# Patient Record
Sex: Female | Born: 1955 | Race: White | Hispanic: No | Marital: Married | Smoking: Never smoker
Health system: Southern US, Community
[De-identification: ages and names within clinical notes are randomized; demographics above are authoritative.]

## PROBLEM LIST (undated history)

## (undated) DIAGNOSIS — G43909 Migraine, unspecified, not intractable, without status migrainosus: Secondary | ICD-10-CM

## (undated) DIAGNOSIS — T1491XA Suicide attempt, initial encounter: Secondary | ICD-10-CM

## (undated) DIAGNOSIS — I959 Hypotension, unspecified: Secondary | ICD-10-CM

## (undated) DIAGNOSIS — F0781 Postconcussional syndrome: Secondary | ICD-10-CM

## (undated) DIAGNOSIS — F329 Major depressive disorder, single episode, unspecified: Secondary | ICD-10-CM

## (undated) DIAGNOSIS — S8990XA Unspecified injury of unspecified lower leg, initial encounter: Secondary | ICD-10-CM

## (undated) DIAGNOSIS — D242 Benign neoplasm of left breast: Secondary | ICD-10-CM

## (undated) HISTORY — DX: Migraine, unspecified, not intractable, without status migrainosus: G43.909

## (undated) HISTORY — DX: Benign neoplasm of left breast: D24.2

## (undated) HISTORY — DX: Postconcussional syndrome: F07.81

## (undated) HISTORY — DX: Unspecified injury of unspecified lower leg, initial encounter: S89.90XA

## (undated) HISTORY — DX: Suicide attempt, initial encounter: T14.91XA

---

## 1998-09-28 ENCOUNTER — Emergency Department (HOSPITAL_COMMUNITY): Admission: EM | Admit: 1998-09-28 | Discharge: 1998-09-28 | Payer: Self-pay | Admitting: Emergency Medicine

## 1998-09-28 ENCOUNTER — Encounter: Payer: Self-pay | Admitting: Emergency Medicine

## 2004-07-28 ENCOUNTER — Encounter: Admission: RE | Admit: 2004-07-28 | Discharge: 2004-07-28 | Payer: Self-pay | Admitting: Family Medicine

## 2011-04-05 DIAGNOSIS — T1491XA Suicide attempt, initial encounter: Secondary | ICD-10-CM

## 2011-04-05 HISTORY — DX: Suicide attempt, initial encounter: T14.91XA

## 2011-08-10 ENCOUNTER — Other Ambulatory Visit (HOSPITAL_COMMUNITY)
Admission: RE | Admit: 2011-08-10 | Discharge: 2011-08-10 | Disposition: A | Discharge status: Home / Self Care | Payer: BC Managed Care – PPO | Source: Ambulatory Visit | Attending: Family Medicine | Admitting: Family Medicine

## 2011-08-10 DIAGNOSIS — Z1159 Encounter for screening for other viral diseases: Secondary | ICD-10-CM | POA: Insufficient documentation

## 2011-08-10 DIAGNOSIS — Z124 Encounter for screening for malignant neoplasm of cervix: Secondary | ICD-10-CM | POA: Insufficient documentation

## 2011-10-18 ENCOUNTER — Observation Stay (HOSPITAL_COMMUNITY)
Admission: EM | Admit: 2011-10-18 | Discharge: 2011-10-19 | Disposition: A | Discharge status: Home / Self Care | Payer: BC Managed Care – PPO | Attending: Internal Medicine | Admitting: Internal Medicine

## 2011-10-18 ENCOUNTER — Encounter (HOSPITAL_COMMUNITY): Payer: Self-pay | Admitting: Emergency Medicine

## 2011-10-18 ENCOUNTER — Emergency Department (HOSPITAL_COMMUNITY): Payer: BC Managed Care – PPO

## 2011-10-18 DIAGNOSIS — E876 Hypokalemia: Secondary | ICD-10-CM | POA: Insufficient documentation

## 2011-10-18 DIAGNOSIS — T450X4A Poisoning by antiallergic and antiemetic drugs, undetermined, initial encounter: Secondary | ICD-10-CM | POA: Insufficient documentation

## 2011-10-18 DIAGNOSIS — T391X1A Poisoning by 4-Aminophenol derivatives, accidental (unintentional), initial encounter: Secondary | ICD-10-CM | POA: Insufficient documentation

## 2011-10-18 DIAGNOSIS — T39314A Poisoning by propionic acid derivatives, undetermined, initial encounter: Principal | ICD-10-CM | POA: Insufficient documentation

## 2011-10-18 DIAGNOSIS — R000 Tachycardia, unspecified: Secondary | ICD-10-CM | POA: Insufficient documentation

## 2011-10-18 DIAGNOSIS — T50901A Poisoning by unspecified drugs, medicaments and biological substances, accidental (unintentional), initial encounter: Secondary | ICD-10-CM

## 2011-10-18 DIAGNOSIS — E860 Dehydration: Secondary | ICD-10-CM | POA: Insufficient documentation

## 2011-10-18 DIAGNOSIS — IMO0002 Reserved for concepts with insufficient information to code with codable children: Secondary | ICD-10-CM | POA: Diagnosis present

## 2011-10-18 DIAGNOSIS — R4182 Altered mental status, unspecified: Secondary | ICD-10-CM | POA: Insufficient documentation

## 2011-10-18 DIAGNOSIS — T398X2A Poisoning by other nonopioid analgesics and antipyretics, not elsewhere classified, intentional self-harm, initial encounter: Secondary | ICD-10-CM | POA: Insufficient documentation

## 2011-10-18 DIAGNOSIS — T189XXA Foreign body of alimentary tract, part unspecified, initial encounter: Secondary | ICD-10-CM

## 2011-10-18 DIAGNOSIS — T394X2A Poisoning by antirheumatics, not elsewhere classified, intentional self-harm, initial encounter: Secondary | ICD-10-CM | POA: Insufficient documentation

## 2011-10-18 DIAGNOSIS — T50992A Poisoning by other drugs, medicaments and biological substances, intentional self-harm, initial encounter: Secondary | ICD-10-CM | POA: Insufficient documentation

## 2011-10-18 LAB — RAPID URINE DRUG SCREEN, HOSP PERFORMED
Amphetamines: NOT DETECTED
Benzodiazepines: NOT DETECTED
Cocaine: NOT DETECTED
Opiates: NOT DETECTED

## 2011-10-18 LAB — CBC
HCT: 38.3 % (ref 36.0–46.0)
Hemoglobin: 12.6 g/dL (ref 12.0–15.0)
MCH: 30.2 pg (ref 26.0–34.0)
MCHC: 32.9 g/dL (ref 30.0–36.0)
RDW: 13.4 % (ref 11.5–15.5)

## 2011-10-18 LAB — ACETAMINOPHEN LEVEL: Acetaminophen (Tylenol), Serum: 32.6 ug/mL — ABNORMAL HIGH (ref 10–30)

## 2011-10-18 LAB — COMPREHENSIVE METABOLIC PANEL
Albumin: 3.8 g/dL (ref 3.5–5.2)
Alkaline Phosphatase: 56 U/L (ref 39–117)
BUN: 12 mg/dL (ref 6–23)
Calcium: 9 mg/dL (ref 8.4–10.5)
Creatinine, Ser: 0.82 mg/dL (ref 0.50–1.10)
GFR calc Af Amer: >90 mL/min (ref >90)
Glucose, Bld: 110 mg/dL — ABNORMAL HIGH (ref 70–99)
Potassium: 3.1 mEq/L — ABNORMAL LOW (ref 3.5–5.1)
Total Protein: 6.4 g/dL (ref 6.0–8.3)

## 2011-10-18 LAB — ETHANOL: Alcohol, Ethyl (B): <11 mg/dL (ref 0–11)

## 2011-10-18 LAB — PROTIME-INR: Prothrombin Time: 13.8 seconds (ref 11.6–15.2)

## 2011-10-18 MED ORDER — ONDANSETRON HCL 4 MG PO TABS: 4.0000 mg | ORAL_TABLET | Freq: Four times a day (QID) | ORAL | Status: DC | PRN

## 2011-10-18 MED ORDER — SODIUM CHLORIDE 0.9 % IV BOLUS (SEPSIS)
1000.0000 mL | Freq: Once | INTRAVENOUS | Status: AC
  Administered 2011-10-18: 1000 mL via INTRAVENOUS

## 2011-10-18 MED ORDER — POTASSIUM CHLORIDE CRYS ER 20 MEQ PO TBCR
60.0000 meq | EXTENDED_RELEASE_TABLET | Freq: Once | ORAL | Status: AC
  Administered 2011-10-18: 60 meq via ORAL
  Filled 2011-10-18 (×2): qty 3

## 2011-10-18 MED ORDER — ONDANSETRON HCL 4 MG/2ML IJ SOLN: 4.0000 mg | Freq: Four times a day (QID) | INTRAMUSCULAR | Status: DC | PRN

## 2011-10-18 MED ORDER — SODIUM CHLORIDE 0.9 % IV SOLN
INTRAVENOUS | Status: DC
  Administered 2011-10-18 – 2011-10-19 (×2): via INTRAVENOUS

## 2011-10-18 MED ORDER — SODIUM CHLORIDE 0.9 % IJ SOLN: 3.0000 mL | Freq: Two times a day (BID) | INTRAMUSCULAR | Status: DC

## 2011-10-18 NOTE — ED Notes (Signed)
Campos, MD at bedside.  
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
## 2011-10-18 NOTE — ED Notes (Signed)
Husband returned with pills he found that he thinks she may have taken. Pill was red, oblong, and had imprint [phone] on one side. Drug identifier seemed to think that this was acetaminophen-caffeine 500mg /65mg . Husband also reports missing a bottle of capsulated benadryl that he was given by the pharmacist who stated that it was "stronger than normal." Husband was also concerned bc pt does not have health insurance.
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
## 2011-10-18 NOTE — ED Notes (Addendum)
Introduced self to pt. Pt is more coherent now than when she came in per previous nurse but she was able to tell me that she went into camper to get sleep because it was quiet. States she took aleve earlier today but does not remember why. Pt talks clearly, begins to talk uncoherently, then gazes off into a blank stare. VS stable. NAD.

## 2011-10-18 NOTE — ED Notes (Signed)
Companion remains at bedside.  

## 2011-10-18 NOTE — H&P (Signed)
[facility] History and Physical  Jadalyn Oliveri ZOX:[id number] DOB: 1955-04-11 DOA: 10/18/2011  Referring physician: Azalia Bilis PCP: No primary provider on file.   Chief Complaint: Altered mental status  HPI:  Mrs. Norma Bowman is a 56 year old Caucasian female with no significant past medical history, patient brought to the hospital because of confusion. Her husband at bedside, the history mainly obtained from him and from the ED physician notes. Husband stated that she was in her usual state of health until this morning, close to 10 in the morning she brought him to a late breakfast at his job site 5 minutes from home, she was normal at that time. Then she called them and his cell phone and told him she losing them and that she ingested "some pills", patient husband's drove back home, he found her in their camper in their driveway. She was confused he tried to call a neighbor to help and ended up calling 911. Patient is confused, other than that her physical examination is okay, upon initial evaluation in the emergency department her urine tox screen is negative, her acetaminophen level is slightly elevated at 32.6. Patient will be admitted to the hospital as observation.   Review of Systems:  Unobtainable because of confusion.  History reviewed. No pertinent past medical history. No past surgical history on file. Social History:  does not have a smoking history on file. She does not have any smokeless tobacco history on file. Her alcohol and drug histories not on file. The patient lives at home, ambulatory without assistance  No Known Allergies  No family history on file. unable to obtain patient confused  Prior to Admission medications   Not on File   Physical Exam: Filed Vitals:   10/18/11 1240 10/18/11 1315 10/18/11 1427  BP: 132/67  129/66  Pulse: 116  91  Temp: 98.7 F (37.1 C) 99.3 F (37.4 C)   TempSrc: Oral Rectal   Resp: 20  16  SpO2: 98%  100%    General  appearance: alert, confused and disoriented Head: Normocephalic, without obvious abnormality, atraumatic  Eyes: conjunctivae/corneas clear. PERRL, EOM's intact. Fundi benign.  Nose: Nares normal. Septum midline. Mucosa normal. No drainage or sinus tenderness.  Throat: lips, mucosa, and tongue normal; teeth and gums normal  Neck: Supple, no masses, no cervical lymphadenopathy, no JVD appreciated, no meningeal signs  Resp: clear to auscultation bilaterally  Chest wall: no tenderness  Cardio: regular rate and rhythm, S1, S2 normal, no murmur, click, rub or gallop  GI: soft, non-tender; bowel sounds normal; no masses, no organomegaly  Extremities: extremities normal, atraumatic, right leg was external rotation  Skin: Skin color, texture, turgor normal. No rashes or lesions  Neurologic: Alert, disoriented, neurological examination gait was not tested, she cannot follow commands    Labs on Admission:  Basic Metabolic Panel:  Lab 10/18/11 8119  NA 139  K 3.1*  CL 104  CO2 21  GLUCOSE 110*  BUN 12  CREATININE 0.82  CALCIUM 9.0  MG --  PHOS --   Liver Function Tests:  Lab 10/18/11 1305  AST 21  ALT 10  ALKPHOS 56  BILITOT 0.3  PROT 6.4  ALBUMIN 3.8   No results found for this basename: LIPASE:5,AMYLASE:5 in the last 168 hours  Lab 10/18/11 1305  AMMONIA 31   CBC:  Lab 10/18/11 1305  WBC 5.7  NEUTROABS --  HGB 12.6  HCT 38.3  MCV 91.8  PLT 198   Cardiac Enzymes: No results  found for this basename: CKTOTAL:5,CKMB:5,CKMBINDEX:5,TROPONINI:5 in the last 168 hours  BNP (last 3 results) No results found for this basename: PROBNP:3 in the last 8760 hours CBG: No results found for this basename: GLUCAP:5 in the last 168 hours  Radiological Exams on Admission: Ct Head Wo Contrast  10/18/2011  *RADIOLOGY REPORT*  Clinical Data: Altered mental status  CT HEAD WITHOUT CONTRAST  Technique:  Contiguous axial images were obtained from the base of the skull through the vertex  without contrast.  Comparison: None.  Findings: Ventricle size is normal.  Negative for acute or chronic infarct.  Negative for hemorrhage or mass.  No fluid collection. Paranasal sinuses are clear.  Negative skull.  IMPRESSION: Negative  Original Report Authenticated By: Camelia Phenes, M.D.    EKG: Independently reviewed.  Assessment/Plan Active Problems:  Altered mental state  Drug ingestion  Tachycardia  Altered mental status This is likely secondary to unidentified drug ingestion, patient evaluated by urine tox screen which is been negative, salicylate level is less than 2, alcohol level is less than 11. Acetaminophen level is slightly elevated at 32.6. CT scan of the head showed no acute findings. I will check acetaminophen level in 4 hours, currently there is no indication according to acetaminophen toxicity normogram to treat, if we considered the indigestion time between 10 and 11.  Drug ingestion As mentioned above unidentified drug. We'll monitor patient on telemetry, check liver function test and INR. I will consult psychiatry as husband mentioned that he had a recent marital problems and he is not so sure this was accidental ingestion.  Tachycardia 12-lead EKG showed sinus tachycardia, no other evidences of abnormality. Likely secondary to mild dehydration as husband said she did not eat and she was taking out in a camper. Hydrate with IV fluids, patient is on telemetry.  Code Status: Full Family Communication: Husband was at bedside, plan discussed with him. Disposition Plan: Home  Time spent: 65 minutes  [facility] Pager [phone]  If 7PM-7AM, please contact night-coverage [email or web address] Password [facility] 10/18/2011, 3:27 PM

## 2011-10-18 NOTE — ED Notes (Signed)
Pt transported to radiology via stretcher with tech.  

## 2011-10-19 DIAGNOSIS — E876 Hypokalemia: Secondary | ICD-10-CM

## 2011-10-19 DIAGNOSIS — T450X4A Poisoning by antiallergic and antiemetic drugs, undetermined, initial encounter: Secondary | ICD-10-CM

## 2011-10-19 DIAGNOSIS — R5381 Other malaise: Secondary | ICD-10-CM

## 2011-10-19 DIAGNOSIS — T50992A Poisoning by other drugs, medicaments and biological substances, intentional self-harm, initial encounter: Secondary | ICD-10-CM

## 2011-10-19 DIAGNOSIS — T391X1A Poisoning by 4-Aminophenol derivatives, accidental (unintentional), initial encounter: Secondary | ICD-10-CM

## 2011-10-19 DIAGNOSIS — T39314A Poisoning by propionic acid derivatives, undetermined, initial encounter: Secondary | ICD-10-CM

## 2011-10-19 DIAGNOSIS — T394X2A Poisoning by antirheumatics, not elsewhere classified, intentional self-harm, initial encounter: Secondary | ICD-10-CM

## 2011-10-19 LAB — CBC
HCT: 35.1 % — ABNORMAL LOW (ref 36.0–46.0)
Hemoglobin: 11.5 g/dL — ABNORMAL LOW (ref 12.0–15.0)
MCHC: 32.8 g/dL (ref 30.0–36.0)
MCV: 91.9 fL (ref 78.0–100.0)
WBC: 6.6 10*3/uL (ref 4.0–10.5)

## 2011-10-19 LAB — COMPREHENSIVE METABOLIC PANEL
ALT: 9 U/L (ref 0–35)
Albumin: 3 g/dL — ABNORMAL LOW (ref 3.5–5.2)
Alkaline Phosphatase: 44 U/L (ref 39–117)
Calcium: 8.7 mg/dL (ref 8.4–10.5)
GFR calc Af Amer: >90 mL/min (ref >90)
Glucose, Bld: 88 mg/dL (ref 70–99)
Potassium: 4.3 mEq/L (ref 3.5–5.1)
Sodium: 141 mEq/L (ref 135–145)
Total Protein: 5.5 g/dL — ABNORMAL LOW (ref 6.0–8.3)

## 2011-10-19 LAB — PROTIME-INR
INR: 1.11 (ref 0.00–1.49)
Prothrombin Time: 14.5 seconds (ref 11.6–15.2)
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
## 2011-10-19 NOTE — Discharge Summary (Signed)
Physician Discharge Summary  Lorenna Lurry GNF:[id number] DOB: 04-16-1955 DOA: 10/18/2011  PCP: No primary provider on file.  Admit date: 10/18/2011 Discharge date: 10/19/2011  Recommendations for Outpatient Follow-up:  1-arrange followup with primary care physician over the next 7-10 days. 2-[facility]up [facility] for further evaluation and treatment as an outpatient (appointment on 7/23 2013 4:30 PM)   Discharge Diagnoses:  1-Altered mental state: Secondary to intentional drug ingestion (Advil PM, Benadryl and Excedrin); after fluid resuscitation and hours of observation patient mental status returned back to baseline. Patient reports that she f feels stupid and has some remorse for what happened. Denies suicidal ideation, hallucinations or any other mood disorder at this moment. Follow recommendations from psychiatry, will allow patient to go home and follow on 723 and 4:30 PM with the [facility] for further evaluation and treatment.  2-Drug ingestion: intentional. As mentioned above. Plan is for patient to go to psychiatry as an outpatient for further evaluation and treatment. At discharge no SI and no hallucinations. Patient also denies any major problems in her relationship and is looking to go home with her husband.   3-Tachycardia: due to mild dehydration and use of NSAID's  4-Hypokalemia: repleted.   Discharge Condition: Discharged in a stable and improved condition. Currently without any hallucinations or suicidal ideation. Patient ha has denies any the use of behavioral coming from her husband or any significant problem on care relationship. She is having remorse and feeling stupid for what happened and is looking for outpatient assistance after discharge. Patient did no have any history of suicidal obtained, mental history or mental hospitalizations in the past.  Diet recommendation: Regular diet  History of present illness:  56 year old Caucasian  female with no significant past medical history, patient brought to the hospital because of confusion. Her husband at bedside, the history mainly obtained from him and from the ED physician notes. Husband stated that she was in her usual state of health until the morning of admission; around pain a.m. in the morning she brought him a late breakfast at his job site, 5 minutes from home; she was normal at that time. Then she called him at  his cell phone and told him she losing it and that she ingested "some pills", patient husband's drove back home, he found her in their camper in their driveway. She was confused he tried to call a neighbor for help and ended up calling 911. Patient is confused, upon initial evaluation in the emergency department her urine tox screen was negative, her acetaminophen level is slightly elevated at 32.6. Patient will be admitted to the hospital as observation.   Procedures:  None  Consultations:  Psychiatry  Discharge Exam: Filed Vitals:   10/19/11 1315  BP: 128/75  Pulse: 67  Temp: 98.8 F (37.1 C)  Resp: 18   Filed Vitals:   10/18/11 1716 10/18/11 2157 10/19/11 0514 10/19/11 1315  BP: 152/87 145/81 132/84 128/75  Pulse: 90 79 67 67  Temp: 98.9 F (37.2 C) 98.1 F (36.7 C) 98.1 F (36.7 C) 98.8 F (37.1 C)  TempSrc: Oral Oral Oral Oral  Resp: 18 18 18 18   Height: 5\' 7"  (1.702 m)     Weight: 66.6 kg (146 lb 13.2 oz)     SpO2: 98% 100% 99% 97%   General: Alert, awake and oriented x3, cooperative to examination. In no acute distress. Denies hallucinations and suicidal ideation. Cardiovascular: Regular rate and rhythm, no murmur gallops or rubs.  S1 and S2 Respiratory: Clear to auscultation bilaterally Abdomen: Soft, nontender, no distended; positive bowel sounds. Neurologic exam: CN II-XII grossly intact, no focal motor or sensory deficit appreciated; muscle strength 5 out of 5. Musculoskeletal: Full range of motion; no joint erythema or  swelling.   Discharge Instructions  Discharge Orders    Future Orders Please Complete By Expires   Discharge instructions      Comments:   -Keep herself well hydrated. -Follow with the appointment that has been arranged with a psychiatrist as an outpatient for further evaluation and treatment. -If you experience any suicidal ideation please call 911 -Start taking a multivitamin on daily basis. -Arrange a followup appointment with her primary care physician over the next 7-10 days.     Medication List    Notice       You have not been prescribed any medications.               The results of significant diagnostics from this hospitalization (including imaging, microbiology, ancillary and laboratory) are listed below for reference.    Significant Diagnostic Studies: Ct Head Wo Contrast  10/18/2011  *RADIOLOGY REPORT*  Clinical Data: Altered mental status  CT HEAD WITHOUT CONTRAST  Technique:  Contiguous axial images were obtained from the base of the skull through the vertex without contrast.  Comparison: None.  Findings: Ventricle size is normal.  Negative for acute or chronic infarct.  Negative for hemorrhage or mass.  No fluid collection. Paranasal sinuses are clear.  Negative skull.  IMPRESSION: Negative  Original Report Authenticated By: Camelia Phenes, M.D.    Labs: Basic Metabolic Panel:  Lab 10/19/11 8119 10/18/11 1305  NA 141 139  K 4.3 3.1*  CL 111 104  CO2 24 21  GLUCOSE 88 110*  BUN 7 12  CREATININE 0.74 0.82  CALCIUM 8.7 9.0  MG -- --  PHOS -- --   Liver Function Tests:  Lab 10/19/11 0435 10/18/11 1305  AST 24 21  ALT 9 10  ALKPHOS 44 56  BILITOT 0.4 0.3  PROT 5.5* 6.4  ALBUMIN 3.0* 3.8    Lab 10/18/11 1305  AMMONIA 31   CBC:  Lab 10/19/11 0435 10/18/11 1305  WBC 6.6 5.7  NEUTROABS -- --  HGB 11.5* 12.6  HCT 35.1* 38.3  MCV 91.9 91.8  PLT 192 198    Time coordinating discharge: More than 30  minutes.  SignedVassie Loll [phone]  [facility] 10/19/2011, 2:35 PM
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
## 2011-10-19 NOTE — Discharge Instructions (Signed)
Overdose, Adult    A person can overdose on alcohol, drugs or both by accident or on purpose. If it was on purpose, it is a serious matter. Professional help should be sought. If the overdose was an accident, certain steps should be taken to make sure that it never happens again.  ACCIDENTAL OVERDOSE  Overdosing on prescription medications can be a result of:  · Not understanding the instructions.  · Misreading the label.  · Forgetting that you took a dose and then taking another by mistake. This situation happens a lot.  To make sure this does not happen again:  · Clarify the correct dosage with your caregiver.  · Place the correct dosage in a "pill-minder" container (labeled for each day and time of day).  · Have someone dispense your medicine.  Please be sure to follow-up with your primary care doctor as directed.  INTENTIONAL OVERDOSE  If the overdose was on purpose, it is a serious situation. Taking more than the prescribed amount of medications (including taking someone else's prescription), abusing street drugs or drinking an amount of alcohol that requires medical treatment can show a variety of possible problems. These may indicate you:  · Are depressed or suicidal.  · Are abusing drugs, took too much or combined different drugs to experiment with the effects.  · Mixed alcohol with drugs and did not realize the danger of doing so (this is drug abuse).  · Are suffering addiction to drugs and/or alcohol (also known as chemical dependency).  · Binge drink.  If you have not been referred to a mental health professional for help, it is important that you get help right away. Only a professional can determine which problems may exist and what the best course of treatment may be. It is your responsibility to follow-up with further evaluation or treatment as directed.   Alcohol is responsible for a large number of overdoses and unintended deaths among college-age young adults. Binge drinking is consuming 4-5 drinks  in a short period of time. The amount of alcohol in standard servings of wine (5 oz.), beer (12 oz.) and distilled spirits (1.5 oz., 80 proof) is the same. Beer or wine can be just as dangerous to the binge drinker as "hard" liquor can be.   CONSEQUENCES OF BINGE DRINKING  Alcohol poisoning is the most serious consequence of binge drinking. This is a severe and potentially fatal physical reaction to an alcohol overdose. When too much alcohol is consumed, the brain does not get enough oxygen. The lack of oxygen will eventually cause the brain to shut down the voluntary functions that regulate breathing and heart rate. Symptoms of alcohol poisoning include:  · Vomiting.  · Passing out (unconsciousness).  · Cold, clammy, pale or bluish skin.  · Slow or irregular breathing.  WHAT SHOULD I DO NEXT?  If you have a history of drug abuse or suffer chemical dependency (alcoholism, drug addiction or both), you might consider the following:  · Talk with a qualified substance abuse counselor and consider entering a treatment program.  · Go to a detox facility if necessary.  · If you were attending self-help group meetings, consider returning to them and go often.  · Explore other resources located near you (see sources listed below).  If you are unsure if you have a substance abuse problem, ask yourself the following questions:  · Have you been told by friends or family that drugs/alcohol has become a problem?  · Do you get   into fights when drinking or using drugs?  · Do you have blackouts (not remembering what you do while using)?  · Do you lie about use or amounts of drugs or alcohol you consume?  · Do you need chemicals to get you going?  · Do you suffer in work or school performance because of drug or alcohol use?  · Do you get sick from drug or alcohol use but continue to use anyway?  · Do you need drugs or alcohol to relate to people or feel comfortable in social situations?  · Do you use drugs or alcohol to forget  problems?  If you answered "Yes" to any of the above questions, it means you show signs of chemical dependency and a professional evaluation is suggested. The longer the use of drugs and alcohol continues, the problems will become greater.  SEEK IMMEDIATE MEDICAL CARE IF:   · You feel like you might repeat your problematic behavior.  · You need someone to talk to and feel that it should not wait.  · You feel you are a danger to yourself or someone else.  · You feel like you are having a new reaction to medications you are taking, or you are getting worse after leaving a care center.  · You have an overwhelming urge to drink or use drugs.  Addiction cannot be cured, but it can be treated successfully. Treatment centers are listed in the yellow pages under: Cocaine, Narcotics, and Alcoholics Anonymous. Most hospitals and clinics can refer you to a specialized care center. The [place] government maintains a toll-free number for obtaining treatment referrals: 1-800-662-4357 or 1-800-487-4889 (TDD) and maintains a website: http://findtreatment.samhsa.gov. Other websites for additional information are: www.mentalhealth.samhsa.gov. and www.nida.gov.  In [place] treatment resources are listed in each Province with listings available under [facility] or similar titles.  Document Released: 03/24/2003 Document Revised: 03/10/2011 Document Reviewed: 02/13/2008  ExitCare® Patient Information ©2012 [employer].
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
## 2012-04-04 DIAGNOSIS — F32A Depression, unspecified: Secondary | ICD-10-CM

## 2012-04-04 HISTORY — DX: Depression, unspecified: F32.A

## 2012-07-25 ENCOUNTER — Emergency Department (HOSPITAL_COMMUNITY): Payer: No Typology Code available for payment source

## 2012-07-25 ENCOUNTER — Encounter (HOSPITAL_COMMUNITY): Payer: Self-pay | Admitting: Emergency Medicine

## 2012-07-25 ENCOUNTER — Emergency Department (HOSPITAL_COMMUNITY)
Admission: EM | Admit: 2012-07-25 | Discharge: 2012-07-25 | Disposition: A | Discharge status: Home / Self Care | Payer: No Typology Code available for payment source | Attending: Emergency Medicine | Admitting: Emergency Medicine

## 2012-07-25 DIAGNOSIS — F32A Depression, unspecified: Secondary | ICD-10-CM | POA: Insufficient documentation

## 2012-07-25 DIAGNOSIS — S199XXA Unspecified injury of neck, initial encounter: Secondary | ICD-10-CM | POA: Insufficient documentation

## 2012-07-25 DIAGNOSIS — S79919A Unspecified injury of unspecified hip, initial encounter: Secondary | ICD-10-CM | POA: Insufficient documentation

## 2012-07-25 DIAGNOSIS — F329 Major depressive disorder, single episode, unspecified: Secondary | ICD-10-CM | POA: Insufficient documentation

## 2012-07-25 DIAGNOSIS — S4980XA Other specified injuries of shoulder and upper arm, unspecified arm, initial encounter: Secondary | ICD-10-CM | POA: Insufficient documentation

## 2012-07-25 DIAGNOSIS — Y92410 Unspecified street and highway as the place of occurrence of the external cause: Secondary | ICD-10-CM | POA: Insufficient documentation

## 2012-07-25 DIAGNOSIS — S0993XA Unspecified injury of face, initial encounter: Secondary | ICD-10-CM | POA: Insufficient documentation

## 2012-07-25 DIAGNOSIS — S79929A Unspecified injury of unspecified thigh, initial encounter: Secondary | ICD-10-CM | POA: Insufficient documentation

## 2012-07-25 DIAGNOSIS — Y9389 Activity, other specified: Secondary | ICD-10-CM | POA: Insufficient documentation

## 2012-07-25 DIAGNOSIS — S46909A Unspecified injury of unspecified muscle, fascia and tendon at shoulder and upper arm level, unspecified arm, initial encounter: Secondary | ICD-10-CM | POA: Insufficient documentation

## 2012-07-25 DIAGNOSIS — Z8659 Personal history of other mental and behavioral disorders: Secondary | ICD-10-CM | POA: Insufficient documentation

## 2012-07-25 HISTORY — DX: Major depressive disorder, single episode, unspecified: F32.9

## 2012-07-25 MED ORDER — IBUPROFEN 400 MG PO TABS
800.0000 mg | ORAL_TABLET | Freq: Once | ORAL | Status: AC
  Administered 2012-07-25: 800 mg via ORAL
  Filled 2012-07-25: qty 2

## 2012-07-25 MED ORDER — DIAZEPAM 5 MG PO TABS: 5.0000 mg | ORAL_TABLET | Freq: Two times a day (BID) | ORAL | Status: DC

## 2012-07-25 MED ORDER — OXYCODONE-ACETAMINOPHEN 5-325 MG PO TABS: ORAL_TABLET | ORAL | Status: DC
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
## 2012-07-25 NOTE — ED Provider Notes (Signed)
Medical screening examination/treatment/procedure(s) were performed by non-physician practitioner and as supervising physician I was immediately available for consultation/collaboration.   Loren Racer, MD 07/25/12 [phone]

## 2012-07-25 NOTE — ED Notes (Signed)
C collar removed per order Glade Nurse, PA.
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
## 2012-08-21 ENCOUNTER — Other Ambulatory Visit: Payer: Self-pay

## 2012-08-21 DIAGNOSIS — Z1231 Encounter for screening mammogram for malignant neoplasm of breast: Secondary | ICD-10-CM

## 2012-09-04 ENCOUNTER — Ambulatory Visit
Admission: RE | Admit: 2012-09-04 | Discharge: 2012-09-04 | Disposition: A | Discharge status: Home / Self Care | Payer: BC Managed Care – PPO | Source: Ambulatory Visit

## 2012-09-04 DIAGNOSIS — Z1231 Encounter for screening mammogram for malignant neoplasm of breast: Secondary | ICD-10-CM

## 2012-09-07 ENCOUNTER — Other Ambulatory Visit: Payer: Self-pay | Admitting: Family Medicine

## 2012-09-07 DIAGNOSIS — R928 Other abnormal and inconclusive findings on diagnostic imaging of breast: Secondary | ICD-10-CM

## 2012-09-21 ENCOUNTER — Ambulatory Visit
Admission: RE | Admit: 2012-09-21 | Discharge: 2012-09-21 | Disposition: A | Discharge status: Home / Self Care | Payer: BC Managed Care – PPO | Source: Ambulatory Visit | Attending: Family Medicine | Admitting: Family Medicine

## 2012-09-21 DIAGNOSIS — R928 Other abnormal and inconclusive findings on diagnostic imaging of breast: Secondary | ICD-10-CM

## 2013-03-04 ENCOUNTER — Other Ambulatory Visit: Payer: Self-pay | Admitting: Family Medicine

## 2013-03-04 DIAGNOSIS — R921 Mammographic calcification found on diagnostic imaging of breast: Secondary | ICD-10-CM

## 2013-03-18 ENCOUNTER — Ambulatory Visit
Admission: RE | Admit: 2013-03-18 | Discharge: 2013-03-18 | Disposition: A | Discharge status: Home / Self Care | Payer: BC Managed Care – PPO | Source: Ambulatory Visit | Attending: Family Medicine | Admitting: Family Medicine

## 2013-03-18 DIAGNOSIS — R921 Mammographic calcification found on diagnostic imaging of breast: Secondary | ICD-10-CM

## 2014-06-22 ENCOUNTER — Emergency Department (HOSPITAL_COMMUNITY)
Admission: EM | Admit: 2014-06-22 | Discharge: 2014-06-23 | Disposition: A | Discharge status: Home / Self Care | Payer: Self-pay | Attending: Emergency Medicine | Admitting: Emergency Medicine

## 2014-06-22 ENCOUNTER — Encounter (HOSPITAL_COMMUNITY): Payer: Self-pay | Admitting: *Deleted

## 2014-06-22 ENCOUNTER — Emergency Department (HOSPITAL_COMMUNITY): Payer: Self-pay

## 2014-06-22 DIAGNOSIS — Y9389 Activity, other specified: Secondary | ICD-10-CM | POA: Insufficient documentation

## 2014-06-22 DIAGNOSIS — F329 Major depressive disorder, single episode, unspecified: Secondary | ICD-10-CM | POA: Insufficient documentation

## 2014-06-22 DIAGNOSIS — Z79899 Other long term (current) drug therapy: Secondary | ICD-10-CM | POA: Insufficient documentation

## 2014-06-22 DIAGNOSIS — M79672 Pain in left foot: Secondary | ICD-10-CM

## 2014-06-22 DIAGNOSIS — Y998 Other external cause status: Secondary | ICD-10-CM | POA: Insufficient documentation

## 2014-06-22 DIAGNOSIS — W010XXA Fall on same level from slipping, tripping and stumbling without subsequent striking against object, initial encounter: Secondary | ICD-10-CM | POA: Insufficient documentation

## 2014-06-22 DIAGNOSIS — S93602A Unspecified sprain of left foot, initial encounter: Secondary | ICD-10-CM | POA: Insufficient documentation

## 2014-06-22 DIAGNOSIS — Y92512 Supermarket, store or market as the place of occurrence of the external cause: Secondary | ICD-10-CM | POA: Insufficient documentation

## 2014-06-22 MED ORDER — IBUPROFEN 200 MG PO TABS
400.0000 mg | ORAL_TABLET | Freq: Once | ORAL | Status: AC
  Administered 2014-06-22: 400 mg via ORAL
  Filled 2014-06-22: qty 2

## 2014-06-22 NOTE — ED Notes (Signed)
During triage process, patient upset r/t BP  Patient states that her BP is "usually very low, so this is high for me" Patient wants EDP to eval BP Patient remains in NAD

## 2014-06-22 NOTE — ED Notes (Signed)
Pt returned from x-ray by X-ray Tech; pt attached to BP/monitoring
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
## 2014-06-22 NOTE — Discharge Instructions (Signed)
It was our pleasure to provide your ER care today - we hope that you feel better.  Take motrin or aleve as need for pain.  Follow up with primary care doctor in 1 week for recheck if symptoms fail to improve/resolve.  Return to ER if worse, new symptoms, severe pain, medical emergency, other concern.    Foot Sprain The muscles and cord like structures which attach muscle to bone (tendons) that surround the feet are made up of units. A foot sprain can occur at the weakest spot in any of these units. This condition is most often caused by injury to or overuse of the foot, as from playing contact sports, or aggravating a previous injury, or from poor conditioning, or obesity. SYMPTOMS  Pain with movement of the foot.  Tenderness and swelling at the injury site.  Loss of strength is present in moderate or severe sprains. THE THREE GRADES OR SEVERITY OF FOOT SPRAIN ARE:  Mild (Grade I): Slightly pulled muscle without tearing of muscle or tendon fibers or loss of strength.  Moderate (Grade II): Tearing of fibers in a muscle, tendon, or at the attachment to bone, with small decrease in strength.  Severe (Grade III): Rupture of the muscle-tendon-bone attachment, with separation of fibers. Severe sprain requires surgical repair. Often repeating (chronic) sprains are caused by overuse. Sudden (acute) sprains are caused by direct injury or over-use. DIAGNOSIS  Diagnosis of this condition is usually by your own observation. If problems continue, a caregiver may be required for further evaluation and treatment. X-rays may be required to make sure there are not breaks in the bones (fractures) present. Continued problems may require physical therapy for treatment. PREVENTION  Use strength and conditioning exercises appropriate for your sport.  Warm up properly prior to working out.  Use athletic shoes that are made for the sport you are participating in.  Allow adequate time for healing. Early  return to activities makes repeat injury more likely, and can lead to an unstable arthritic foot that can result in prolonged disability. Mild sprains generally heal in 3 to 10 days, with moderate and severe sprains taking 2 to 10 weeks. Your caregiver can help you determine the proper time required for healing. HOME CARE INSTRUCTIONS   Apply ice to the injury for 15-20 minutes, 03-04 times per day. Put the ice in a plastic bag and place a towel between the bag of ice and your skin.  An elastic wrap (like an Ace bandage) may be used to keep swelling down.  Keep foot above the level of the heart, or at least raised on a footstool, when swelling and pain are present.  Try to avoid use other than gentle range of motion while the foot is painful. Do not resume use until instructed by your caregiver. Then begin use gradually, not increasing use to the point of pain. If pain does develop, decrease use and continue the above measures, gradually increasing activities that do not cause discomfort, until you gradually achieve normal use.  Use crutches if and as instructed, and for the length of time instructed.  Keep injured foot and ankle wrapped between treatments.  Massage foot and ankle for comfort and to keep swelling down. Massage from the toes up towards the knee.  Only take over-the-counter or prescription medicines for pain, discomfort, or fever as directed by your caregiver. SEEK IMMEDIATE MEDICAL CARE IF:   Your pain and swelling increase, or pain is not controlled with medications.  You have loss  of feeling in your foot or your foot turns cold or blue.  You develop new, unexplained symptoms, or an increase of the symptoms that brought you to your caregiver. MAKE SURE YOU:   Understand these instructions.  Will watch your condition.  Will get help right away if you are not doing well or get worse. Document Released: 09/10/2001 Document Revised: 06/13/2011 Document Reviewed:  11/08/2007 [facility] Patient Information 2015 [place], [place]. This information is not intended to replace advice given to you by your health care provider. Make sure you discuss any questions you have with your health care provider.    Cryotherapy Cryotherapy means treatment with cold. Ice or gel packs can be used to reduce both pain and swelling. Ice is the most helpful within the first 24 to 48 hours after an injury or flare-up from overusing a muscle or joint. Sprains, strains, spasms, burning pain, shooting pain, and aches can all be eased with ice. Ice can also be used when recovering from surgery. Ice is effective, has very few side effects, and is safe for most people to use. PRECAUTIONS  Ice is not a safe treatment option for people with:  Raynaud phenomenon. This is a condition affecting small blood vessels in the extremities. Exposure to cold may cause your problems to return.  Cold hypersensitivity. There are many forms of cold hypersensitivity, including:  Cold urticaria. Red, itchy hives appear on the skin when the tissues begin to warm after being iced.  Cold erythema. This is a red, itchy rash caused by exposure to cold.  Cold hemoglobinuria. Red blood cells break down when the tissues begin to warm after being iced. The hemoglobin that carry oxygen are passed into the urine because they cannot combine with blood proteins fast enough.  Numbness or altered sensitivity in the area being iced. If you have any of the following conditions, do not use ice until you have discussed cryotherapy with your caregiver:  Heart conditions, such as arrhythmia, angina, or chronic heart disease.  High blood pressure.  Healing wounds or open skin in the area being iced.  Current infections.  Rheumatoid arthritis.  Poor circulation.  Diabetes. Ice slows the blood flow in the region it is applied. This is beneficial when trying to stop inflamed tissues from spreading irritating  chemicals to surrounding tissues. However, if you expose your skin to cold temperatures for too long or without the proper protection, you can damage your skin or nerves. Watch for signs of skin damage due to cold. HOME CARE INSTRUCTIONS Follow these tips to use ice and cold packs safely.  Place a dry or damp towel between the ice and skin. A damp towel will cool the skin more quickly, so you may need to shorten the time that the ice is used.  For a more rapid response, add gentle compression to the ice.  Ice for no more than 10 to 20 minutes at a time. The bonier the area you are icing, the less time it will take to get the benefits of ice.  Check your skin after 5 minutes to make sure there are no signs of a poor response to cold or skin damage.  Rest 20 minutes or more between uses.  Once your skin is numb, you can end your treatment. You can test numbness by very lightly touching your skin. The touch should be so light that you do not see the skin dimple from the pressure of your fingertip. When using ice, most people will  feel these normal sensations in this order: cold, burning, aching, and numbness.  Do not use ice on someone who cannot communicate their responses to pain, such as small children or people with dementia. HOW TO MAKE AN ICE PACK Ice packs are the most common way to use ice therapy. Other methods include ice massage, ice baths, and cryosprays. Muscle creams that cause a cold, tingly feeling do not offer the same benefits that ice offers and should not be used as a substitute unless recommended by your caregiver. To make an ice pack, do one of the following:  Place crushed ice or a bag of frozen vegetables in a sealable plastic bag. Squeeze out the excess air. Place this bag inside another plastic bag. Slide the bag into a pillowcase or place a damp towel between your skin and the bag.  Mix 3 parts water with 1 part rubbing alcohol. Freeze the mixture in a sealable plastic  bag. When you remove the mixture from the freezer, it will be slushy. Squeeze out the excess air. Place this bag inside another plastic bag. Slide the bag into a pillowcase or place a damp towel between your skin and the bag. SEEK MEDICAL CARE IF:  You develop white spots on your skin. This may give the skin a blotchy (mottled) appearance.  Your skin turns blue or pale.  Your skin becomes waxy or hard.  Your swelling gets worse. MAKE SURE YOU:   Understand these instructions.  Will watch your condition.  Will get help right away if you are not doing well or get worse. Document Released: 11/15/2010 Document Revised: 08/05/2013 Document Reviewed: 11/15/2010 [facility] Patient Information 2015 [place], [place]. This information is not intended to replace advice given to you by your health care provider. Make sure you discuss any questions you have with your health care provider.

## 2014-06-22 NOTE — ED Provider Notes (Signed)
CSN: [id number]     Arrival date & time 06/22/14  2146 History   First MD Initiated Contact with Patient 06/22/14 2204     Chief Complaint  Patient presents with  . Fall     (Consider location/radiation/quality/duration/timing/severity/associated sxs/prior Treatment) The history is provided by the patient.  pt s/p slip and fall at grocery store just pta tonight. Pt states floor was wet/slippery, twisted left foot. Braced fall w right hand/arm.  No faintness or dizziness prior to fall. No loc w fall. No head injury or headache. No neck or back pain. Ambulatory post fall, and drove self to ED.  C/o pain to lateral aspect left mid foot. Constant. Dull. Moderate. No ankle or proximal tib fib pain. States right hand/arm not painful. Denies other pain/injury.  Past Medical History  Diagnosis Date  . Depression    History reviewed. No pertinent past surgical history. History reviewed. No pertinent family history. History  Substance Use Topics  . Smoking status: Never Smoker   . Smokeless tobacco: Not on file  . Alcohol Use: Yes   OB History    No data available     Review of Systems  Constitutional: Negative for fever.  Cardiovascular: Negative for chest pain.  Gastrointestinal: Negative for abdominal pain.  Musculoskeletal: Negative for back pain and neck pain.  Skin: Negative for wound.  Neurological: Negative for weakness, numbness and headaches.      Allergies  Wellbutrin  Home Medications   Prior to Admission medications   Medication Sig Start Date End Date Taking? Authorizing Provider  diazepam (VALIUM) 5 MG tablet Take 1 tablet (5 mg total) by mouth 2 (two) times daily. Patient not taking: Reported on 06/22/2014 07/25/12   Marny Lowenstein, PA-C  oxyCODONE-acetaminophen (PERCOCET) 5-325 MG per tablet Take 1 or 2 tablets every 4-6 hours as needed for pain Patient not taking: Reported on 06/22/2014 07/25/12   Marny Lowenstein, PA-C  sertraline (ZOLOFT) 100 MG tablet Take 100  mg by mouth at bedtime.    Historical Provider, MD   BP 118/69 mmHg  Pulse 60  Temp(Src) 98.3 F (36.8 C) (Oral)  Resp 17  SpO2 97% Physical Exam  Constitutional: She is oriented to person, place, and time. She appears well-developed and well-nourished. No distress.  HENT:  Head: Atraumatic.  Eyes: Conjunctivae are normal. No scleral icterus.  Neck: Neck supple. No tracheal deviation present.  Cardiovascular: Normal rate.   Pulmonary/Chest: Effort normal. No respiratory distress.  Abdominal: Soft. Normal appearance. She exhibits no distension.  Musculoskeletal:  Tenderness along left 5th metatarsal. Skin intact. No gross deformity. Ankle stable. No malleolar tenderness. Distal pulses palp. No knee or proximal tib fib tenderness.  Right hand non tender, no wrist tenderness.   Good rom bil ext, no other pain or bony tenderness noted.  CTLS spine, non tender, aligned, no step off.   Neurological: She is alert and oriented to person, place, and time.  Steady gait.   Skin: Skin is warm and dry. No rash noted.  Psychiatric: She has a normal mood and affect.  Nursing note and vitals reviewed.   ED Course  Procedures (including critical care time)   Dg Foot Complete Left  06/22/2014   CLINICAL DATA:  Twisting injury tonight  EXAM: LEFT FOOT - COMPLETE 3+ VIEW  COMPARISON:  None.  FINDINGS: There is no evidence of fracture or dislocation. There is no evidence of arthropathy or other focal bone abnormality. Soft tissues are unremarkable.  IMPRESSION: Negative.  Electronically Signed   By: Andreas Newport M.D.   On: 06/22/2014 23:34      MDM   icepack to sore area.  Motrin po.  Xrays.  Reviewed nursing notes and prior charts for additional history.   Discussed xrays w pt.  Pt comfortable/drove self to ED.  Pt appears stable for d/c.     Lajean Saver, MD 06/24/14 [phone]

## 2014-06-22 NOTE — ED Notes (Signed)
Pt transported to x-ray by [facility]

## 2014-11-10 ENCOUNTER — Other Ambulatory Visit: Payer: Self-pay

## 2014-11-10 DIAGNOSIS — Z1231 Encounter for screening mammogram for malignant neoplasm of breast: Secondary | ICD-10-CM

## 2014-12-04 DIAGNOSIS — D242 Benign neoplasm of left breast: Secondary | ICD-10-CM

## 2014-12-04 HISTORY — DX: Benign neoplasm of left breast: D24.2

## 2014-12-18 ENCOUNTER — Ambulatory Visit
Admission: RE | Admit: 2014-12-18 | Discharge: 2014-12-18 | Disposition: A | Discharge status: Home / Self Care | Payer: PRIVATE HEALTH INSURANCE | Source: Ambulatory Visit

## 2014-12-18 DIAGNOSIS — Z1231 Encounter for screening mammogram for malignant neoplasm of breast: Secondary | ICD-10-CM

## 2014-12-19 ENCOUNTER — Other Ambulatory Visit: Payer: Self-pay | Admitting: Family Medicine

## 2014-12-19 DIAGNOSIS — R928 Other abnormal and inconclusive findings on diagnostic imaging of breast: Secondary | ICD-10-CM

## 2014-12-24 ENCOUNTER — Ambulatory Visit
Admission: RE | Admit: 2014-12-24 | Discharge: 2014-12-24 | Disposition: A | Discharge status: Home / Self Care | Payer: PRIVATE HEALTH INSURANCE | Source: Ambulatory Visit | Attending: Family Medicine | Admitting: Family Medicine

## 2014-12-24 ENCOUNTER — Inpatient Hospital Stay: Admission: RE | Admit: 2014-12-24 | Payer: PRIVATE HEALTH INSURANCE | Source: Ambulatory Visit

## 2014-12-24 ENCOUNTER — Other Ambulatory Visit: Payer: PRIVATE HEALTH INSURANCE

## 2014-12-24 DIAGNOSIS — R928 Other abnormal and inconclusive findings on diagnostic imaging of breast: Secondary | ICD-10-CM

## 2015-01-30 ENCOUNTER — Emergency Department (HOSPITAL_COMMUNITY): Payer: PRIVATE HEALTH INSURANCE

## 2015-01-30 ENCOUNTER — Emergency Department (HOSPITAL_COMMUNITY)
Admission: EM | Admit: 2015-01-30 | Discharge: 2015-01-30 | Disposition: A | Discharge status: Home / Self Care | Payer: PRIVATE HEALTH INSURANCE | Attending: Emergency Medicine | Admitting: Emergency Medicine

## 2015-01-30 ENCOUNTER — Encounter (HOSPITAL_COMMUNITY): Payer: Self-pay | Admitting: *Deleted

## 2015-01-30 DIAGNOSIS — Y9289 Other specified places as the place of occurrence of the external cause: Secondary | ICD-10-CM | POA: Diagnosis not present

## 2015-01-30 DIAGNOSIS — Z8659 Personal history of other mental and behavioral disorders: Secondary | ICD-10-CM | POA: Diagnosis not present

## 2015-01-30 DIAGNOSIS — Y9301 Activity, walking, marching and hiking: Secondary | ICD-10-CM | POA: Diagnosis not present

## 2015-01-30 DIAGNOSIS — S99912A Unspecified injury of left ankle, initial encounter: Secondary | ICD-10-CM | POA: Diagnosis present

## 2015-01-30 DIAGNOSIS — Y999 Unspecified external cause status: Secondary | ICD-10-CM | POA: Diagnosis not present

## 2015-01-30 DIAGNOSIS — W1843XA Slipping, tripping and stumbling without falling due to stepping from one level to another, initial encounter: Secondary | ICD-10-CM | POA: Diagnosis not present

## 2015-01-30 DIAGNOSIS — S8262XA Displaced fracture of lateral malleolus of left fibula, initial encounter for closed fracture: Secondary | ICD-10-CM | POA: Insufficient documentation

## 2015-01-30 DIAGNOSIS — S80211A Abrasion, right knee, initial encounter: Secondary | ICD-10-CM | POA: Insufficient documentation

## 2015-01-30 HISTORY — DX: Hypotension, unspecified: I95.9

## 2015-01-30 MED ORDER — HYDROCODONE-ACETAMINOPHEN 5-325 MG PO TABS
1.0000 | ORAL_TABLET | Freq: Once | ORAL | Status: AC
  Administered 2015-01-30: 1 via ORAL
  Filled 2015-01-30: qty 1

## 2015-01-30 MED ORDER — ACETAMINOPHEN-CODEINE #3 300-30 MG PO TABS: 1.0000 | ORAL_TABLET | Freq: Four times a day (QID) | ORAL | Status: DC | PRN

## 2015-01-30 NOTE — ED Notes (Signed)
Boot applied to pt's left ankle. Pt states understanding of how boot works and states that it is comfortable. Pt given crutches and instructions on how to use crutches. Pt states that she has used crutches in the past and knows how to use crutches.

## 2015-01-30 NOTE — ED Notes (Signed)
Patient transported to X-ray 

## 2015-01-30 NOTE — Discharge Instructions (Signed)
Ankle Fracture A fracture is a break in a bone. The ankle joint is made up of three bones. These include the lower (distal)sections of your lower leg bones, called the tibia and fibula, along with a bone in your foot, called the talus. Depending on how bad the break is and if more than one ankle joint bone is broken, a cast or splint is used to protect and keep your injured bone from moving while it heals. Sometimes, surgery is required to help the fracture heal properly.  There are two general types of fractures:  Stable fracture. This includes a single fracture line through one bone, with no injury to ankle ligaments. A fracture of the talus that does not have any displacement (movement of the bone on either side of the fracture line) is also stable.  Unstable fracture. This includes more than one fracture line through one or more bones in the ankle joint. It also includes fractures that have displacement of the bone on either side of the fracture line. CAUSES  A direct blow to the ankle.   Quickly and severely twisting your ankle.  Trauma, such as a car accident or falling from a significant height. RISK FACTORS You may be at a higher risk of ankle fracture if:  You have certain medical conditions.  You are involved in high-impact sports.  You are involved in a high-impact car accident. SIGNS AND SYMPTOMS   Tender and swollen ankle.  Bruising around the injured ankle.  Pain on movement of the ankle.  Difficulty walking or putting weight on the ankle.  A cold foot below the site of the ankle injury. This can occur if the blood vessels passing through your injured ankle were also damaged.  Numbness in the foot below the site of the ankle injury. DIAGNOSIS  An ankle fracture is usually diagnosed with a physical exam and X-rays. A CT scan may also be required for complex fractures. TREATMENT  Stable fractures are treated with a cast or splint and using crutches to avoid putting  weight on your injured ankle. This is followed by an ankle strengthening program. Some patients require a special type of cast, depending on other medical problems they may have. Unstable fractures require surgery to ensure the bones heal properly. Your health care provider will tell you what type of fracture you have and the best treatment for your condition. HOME CARE INSTRUCTIONS   Review correct crutch use with your health care provider and use your crutches as directed. Safe use of crutches is extremely important. Misuse of crutches can cause you to fall or cause injury to nerves in your hands or armpits.  Do not put weight or pressure on the injured ankle until directed by your health care provider.  To lessen the swelling, keep the injured leg elevated while sitting or lying down.  Apply ice to the injured area:  Put ice in a plastic bag.  Place a towel between your cast and the bag.  Leave the ice on for 20 minutes, 2-3 times a day.  If you have a plaster or fiberglass cast:  Do not try to scratch the skin under the cast with any objects. This can increase your risk of skin infection.  Check the skin around the cast every day. You may put lotion on any red or sore areas.  Keep your cast dry and clean.  If you have a plaster splint:  Wear the splint as directed.  You may loosen the elastic   around the splint if your toes become numb, tingle, or turn cold or blue.  Do not put pressure on any part of your cast or splint; it may break. Rest your cast only on a pillow the first 24 hours until it is fully hardened.  Your cast or splint can be protected during bathing with a plastic bag sealed to your skin with medical tape. Do not lower the cast or splint into water.  Take medicines as directed by your health care provider. Only take over-the-counter or prescription medicines for pain, discomfort, or fever as directed by your health care provider.  Do not drive a vehicle until  your health care provider specifically tells you it is safe to do so.  If your health care provider has given you a follow-up appointment, it is very important to keep that appointment. Not keeping the appointment could result in a chronic or permanent injury, pain, and disability. If you have any problem keeping the appointment, call the facility for assistance. SEEK MEDICAL CARE IF: You develop increased swelling or discomfort. SEEK IMMEDIATE MEDICAL CARE IF:   Your cast gets damaged or breaks.  You have continued severe pain.  You develop new pain or swelling after the cast was put on.  Your skin or toenails below the injury turn blue or gray.  Your skin or toenails below the injury feel cold, numb, or have loss of sensitivity to touch.  There is a bad smell or pus draining from under the cast. MAKE SURE YOU:   Understand these instructions.  Will watch your condition.  Will get help right away if you are not doing well or get worse.   This information is not intended to replace advice given to you by your health care provider. Make sure you discuss any questions you have with your health care provider.   Document Released: 03/18/2000 Document Revised: 03/26/2013 Document Reviewed: 10/18/2012 Elsevier Interactive Patient Education 2016 [employer]  

## 2015-01-30 NOTE — ED Provider Notes (Signed)
CSN: [id number]     Arrival date & time 01/30/15  2595 History   First MD Initiated Contact with Patient 01/30/15 0703     Chief Complaint  Patient presents with  . Ankle Pain     (Consider location/radiation/quality/duration/timing/severity/associated sxs/prior Treatment) Patient is a 59 y.o. female presenting with ankle pain.  Ankle Pain Location:  Ankle Time since incident: just PTA. Injury: yes   Mechanism of injury comment:  Stepped wrong off the curb Ankle location:  L ankle Pain details:    Quality:  Throbbing   Radiates to:  L leg   Severity:  Severe   Onset quality:  Sudden   Timing:  Constant   Progression:  Unchanged Chronicity:  New Dislocation: no   Prior injury to area:  No Worsened by:  Flexion and extension Ineffective treatments:  NSAIDs Associated symptoms: decreased ROM (with pain) and swelling   Associated symptoms: no back pain, no fatigue, no fever, no neck pain, no numbness and no tingling     Past Medical History  Diagnosis Date  . Depression   . Hypotension    History reviewed. No pertinent past surgical history. No family history on file. Social History  Substance Use Topics  . Smoking status: Never Smoker   . Smokeless tobacco: None  . Alcohol Use: Yes   OB History    No data available     Review of Systems  Constitutional: Negative for fever and fatigue.  HENT: Negative for sore throat.   Eyes: Negative for visual disturbance.  Respiratory: Negative for cough and shortness of breath.   Cardiovascular: Negative for chest pain.  Gastrointestinal: Negative for abdominal pain.  Genitourinary: Negative for difficulty urinating.  Musculoskeletal: Positive for arthralgias. Negative for back pain and neck pain.  Skin: Negative for rash.  Neurological: Negative for syncope and headaches.      Allergies  Wellbutrin  Home Medications   Prior to Admission medications   Medication Sig Start Date End Date Taking? Authorizing  Provider  ibuprofen (ADVIL,MOTRIN) 200 MG tablet Take 600 mg by mouth every 6 (six) hours as needed for fever or mild pain.   Yes Historical Provider, MD  acetaminophen-codeine (TYLENOL #3) 300-30 MG tablet Take 1-2 tablets by mouth every 6 (six) hours as needed for moderate pain. 01/30/15   Gareth Morgan, MD   BP 109/67 mmHg  Pulse 67  Temp(Src) 97.8 F (36.6 C)  Resp 20  SpO2 98% Physical Exam  Constitutional: She is oriented to person, place, and time. She appears well-developed and well-nourished. No distress.  HENT:  Head: Normocephalic and atraumatic.  Eyes: Conjunctivae and EOM are normal.  Neck: Normal range of motion.  Cardiovascular: Normal rate, regular rhythm and intact distal pulses.   Pulmonary/Chest: Effort normal. No respiratory distress.  Musculoskeletal: She exhibits no edema.       Right knee: She exhibits no swelling and no ecchymosis. Lacerations: abrasion. No tenderness found.       Left knee: No tenderness found.       Right ankle: She exhibits normal range of motion, no swelling and no ecchymosis.       Left ankle: She exhibits swelling. She exhibits normal range of motion, no ecchymosis, no deformity, no laceration and normal pulse. Tenderness. Lateral malleolus and AITFL tenderness found. No medial malleolus, no CF ligament, no posterior TFL, no head of 5th metatarsal and no proximal fibula tenderness found.  Neurological: She is alert and oriented to person, place, and time.  Skin:  Skin is warm and dry. No rash noted. She is not diaphoretic. No erythema.  Nursing note and vitals reviewed.   ED Course  Procedures (including critical care time) Labs Review Labs Reviewed - No data to display  Imaging Review Dg Ankle Complete Left  01/30/2015  CLINICAL DATA:  Acute left ankle pain and swelling after twisting injury today. Initial encounter. EXAM: LEFT ANKLE COMPLETE - 3+ VIEW COMPARISON:  None. FINDINGS: Mildly displaced fracture is seen involving the  inferior tip of the distal fibula. This appears to be closed and posttraumatic. Overlying soft tissue swelling is noted. No other bony abnormality is noted. Talar dome appears intact. IMPRESSION: Mildly displaced lateral malleolar fracture is noted. Electronically Signed   By: Marijo Conception, M.D.   On: 01/30/2015 08:09   I have personally reviewed and evaluated these images and lab results as part of my medical decision-making.   EKG Interpretation None      MDM   Final diagnoses:  Fractured lateral malleolus, left, closed, initial encounter   59yo female with no significant medical history presents with concern of left ankle pain after stepping off of the curb this AM and twisting it.  Patient neurovascularly intact. XR of left ankle shows mildly displaced left lateral malleolar fracture, avulsion Weber A fracture. Patient placed in walking boot, given crutches, and instructed to folllow up with Orthopedics. Patient discharged in stable condition with understanding of reasons to return.     Gareth Morgan, MD 01/30/15 1925

## 2015-01-30 NOTE — ED Notes (Signed)
Pt returned from xray

## 2015-01-30 NOTE — ED Notes (Signed)
MD at bedside. 

## 2015-01-30 NOTE — ED Notes (Signed)
Pt presents via POV c/o left ankle pain.  Pt was walking this AM and stepped off the curb, "twisting" her ankle.  Minimal swelling to ankle, sensation intact, +pulse.  Abrasion to right knee and right elbow.  Pt unable to put weight on ankle.  Pt a x 4, NAD.  Family at bedside.

## 2016-07-03 DIAGNOSIS — F0781 Postconcussional syndrome: Secondary | ICD-10-CM

## 2016-07-03 HISTORY — DX: Postconcussional syndrome: F07.81

## 2016-07-05 ENCOUNTER — Emergency Department (HOSPITAL_COMMUNITY): Payer: No Typology Code available for payment source

## 2016-07-05 ENCOUNTER — Emergency Department (HOSPITAL_COMMUNITY)
Admission: EM | Admit: 2016-07-05 | Discharge: 2016-07-05 | Disposition: A | Discharge status: Home / Self Care | Payer: No Typology Code available for payment source | Attending: Emergency Medicine | Admitting: Emergency Medicine

## 2016-07-05 ENCOUNTER — Encounter (HOSPITAL_COMMUNITY): Payer: Self-pay

## 2016-07-05 DIAGNOSIS — S060X0A Concussion without loss of consciousness, initial encounter: Secondary | ICD-10-CM | POA: Diagnosis present

## 2016-07-05 DIAGNOSIS — Y939 Activity, unspecified: Secondary | ICD-10-CM | POA: Insufficient documentation

## 2016-07-05 DIAGNOSIS — Z79899 Other long term (current) drug therapy: Secondary | ICD-10-CM | POA: Insufficient documentation

## 2016-07-05 DIAGNOSIS — Y999 Unspecified external cause status: Secondary | ICD-10-CM | POA: Insufficient documentation

## 2016-07-05 DIAGNOSIS — M542 Cervicalgia: Secondary | ICD-10-CM | POA: Insufficient documentation

## 2016-07-05 DIAGNOSIS — Y92410 Unspecified street and highway as the place of occurrence of the external cause: Secondary | ICD-10-CM | POA: Diagnosis not present
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
## 2016-07-05 NOTE — Discharge Instructions (Signed)
Read the information below.  You may return to the Emergency Department at any time for worsening condition or any new symptoms that concern you. °
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
## 2016-11-29 ENCOUNTER — Other Ambulatory Visit: Payer: Self-pay | Admitting: Family Medicine

## 2016-11-29 ENCOUNTER — Other Ambulatory Visit (HOSPITAL_COMMUNITY)
Admission: RE | Admit: 2016-11-29 | Discharge: 2016-11-29 | Disposition: A | Discharge status: Home / Self Care | Source: Ambulatory Visit | Attending: Family Medicine | Admitting: Family Medicine

## 2016-11-29 DIAGNOSIS — Z124 Encounter for screening for malignant neoplasm of cervix: Secondary | ICD-10-CM | POA: Insufficient documentation

## 2016-12-02 LAB — CYTOLOGY - PAP
Diagnosis: UNDETERMINED — AB
HPV (WINDOPATH): DETECTED — AB

## 2016-12-16 ENCOUNTER — Other Ambulatory Visit: Payer: Self-pay | Admitting: Obstetrics & Gynecology

## 2017-01-16 ENCOUNTER — Ambulatory Visit: Payer: PRIVATE HEALTH INSURANCE | Admitting: Diagnostic Neuroimaging

## 2017-02-19 ENCOUNTER — Emergency Department (HOSPITAL_COMMUNITY)
Admission: EM | Admit: 2017-02-19 | Discharge: 2017-02-19 | Disposition: A | Discharge status: Home / Self Care | Attending: Emergency Medicine | Admitting: Emergency Medicine

## 2017-02-19 ENCOUNTER — Encounter (HOSPITAL_COMMUNITY): Payer: Self-pay

## 2017-02-19 ENCOUNTER — Emergency Department (HOSPITAL_COMMUNITY)

## 2017-02-19 DIAGNOSIS — S82002A Unspecified fracture of left patella, initial encounter for closed fracture: Secondary | ICD-10-CM | POA: Diagnosis not present

## 2017-02-19 DIAGNOSIS — Y92481 Parking lot as the place of occurrence of the external cause: Secondary | ICD-10-CM | POA: Diagnosis not present

## 2017-02-19 DIAGNOSIS — S8992XA Unspecified injury of left lower leg, initial encounter: Secondary | ICD-10-CM | POA: Diagnosis present

## 2017-02-19 DIAGNOSIS — Y9301 Activity, walking, marching and hiking: Secondary | ICD-10-CM | POA: Diagnosis not present

## 2017-02-19 DIAGNOSIS — W010XXA Fall on same level from slipping, tripping and stumbling without subsequent striking against object, initial encounter: Secondary | ICD-10-CM | POA: Insufficient documentation

## 2017-02-19 DIAGNOSIS — Y999 Unspecified external cause status: Secondary | ICD-10-CM | POA: Insufficient documentation

## 2017-02-19 MED ORDER — DICLOFENAC SODIUM 50 MG PO TBEC: 50.0000 mg | DELAYED_RELEASE_TABLET | Freq: Two times a day (BID) | ORAL | 0 refills | Status: DC

## 2017-02-19 MED ORDER — ONDANSETRON 4 MG PO TBDP
4.0000 mg | ORAL_TABLET | Freq: Once | ORAL | Status: AC
  Administered 2017-02-19: 4 mg via ORAL
  Filled 2017-02-19: qty 1

## 2017-02-19 MED ORDER — OXYCODONE-ACETAMINOPHEN 5-325 MG PO TABS
1.0000 | ORAL_TABLET | Freq: Once | ORAL | Status: AC
  Administered 2017-02-19: 1 via ORAL
  Filled 2017-02-19: qty 1

## 2017-02-19 MED ORDER — OXYCODONE-ACETAMINOPHEN 5-325 MG PO TABS
2.0000 | ORAL_TABLET | ORAL | 0 refills | Status: DC | PRN
Start: 2017-02-19 — End: 2017-05-10

## 2017-02-19 NOTE — ED Provider Notes (Signed)
[place] EMERGENCY DEPARTMENT Provider Note   CSN: [id number] Arrival date & time: 02/19/17  1649     History   Chief Complaint Chief Complaint  Patient presents with  . Knee Pain    HPI Norma Bowman is a 61 y.o. female who presents tot he ED with left knee pain s/p fall just prior to arrival to the ED. Patient reports that she was walking in the parking lot at the complex where she lives when she lost her footing and felll on her knees. Patient unable to bear weight on left leg. Patient denies head injury or LOC. She does report that at the time of the fall that the pain was so bad she felt nausea.  HPI  Past Medical History:  Diagnosis Date  . Depression   . Hypotension     Patient Active Problem List   Diagnosis Date Noted  . Depression   . Altered mental state 10/18/2011  . Drug ingestion 10/18/2011  . Tachycardia 10/18/2011    History reviewed. No pertinent surgical history.  OB History    No data available       Home Medications    Prior to Admission medications   Medication Sig Start Date End Date Taking? Authorizing Provider  acetaminophen-codeine (TYLENOL #3) 300-30 MG tablet Take 1-2 tablets by mouth every 6 (six) hours as needed for moderate pain. 01/30/15   Gareth Morgan, MD  diclofenac (VOLTAREN) 50 MG EC tablet Take 1 tablet (50 mg total) 2 (two) times daily by mouth. 02/19/17   Ashley Murrain, NP  ibuprofen (ADVIL,MOTRIN) 200 MG tablet Take 600 mg by mouth every 6 (six) hours as needed for fever or mild pain.    [provider]  oxyCODONE-acetaminophen (PERCOCET/ROXICET) 5-325 MG tablet Take 2 tablets every 4 (four) hours as needed by mouth for severe pain. 02/19/17   Ashley Murrain, NP    Family History History reviewed. No pertinent family history.  Social History Social History   Tobacco Use  . Smoking status: Never Smoker  . Smokeless tobacco: Never Used  Substance Use Topics  . Alcohol use: Yes  . Drug  use: No     Allergies   Wellbutrin [bupropion]   Review of Systems Review of Systems  Constitutional: Negative for diaphoresis.  HENT: Negative.   Eyes: Negative for visual disturbance.  Respiratory: Negative for shortness of breath.   Cardiovascular: Negative for chest pain.  Gastrointestinal: Positive for nausea. Negative for abdominal pain and vomiting.  Genitourinary:       No loss of control of bladder or bowels.  Musculoskeletal: Positive for arthralgias and joint swelling. Negative for neck pain.       Left knee pain  Skin: Positive for wound (abrasion right knee).  Neurological: Negative for dizziness, syncope and headaches.  Psychiatric/Behavioral: Negative for confusion. The patient is not nervous/anxious.      Physical Exam Updated Vital Signs BP (!) 141/85 (BP Location: Right Arm)   Pulse 80   Temp (!) 97.4 F (36.3 C) (Oral)   Resp 16   Ht 5\' 7"  (1.702 m)   Wt 63.5 kg (140 lb)   SpO2 97%   BMI 21.93 kg/m   Physical Exam  Constitutional: She is oriented to person, place, and time. She appears well-developed and well-nourished. No distress.  Patient appears uncomfortable.  HENT:  Head: Normocephalic and atraumatic.  Eyes: Conjunctivae and EOM are normal.  Neck: Normal range of motion. Neck supple.  Cardiovascular: Normal  rate.  Pulmonary/Chest: Effort normal.  Musculoskeletal:       Left knee: She exhibits decreased range of motion, swelling and ecchymosis. She exhibits no laceration and no erythema. Tenderness found.       Legs: Left knee swollen and tender to palpation and any attempt of range of motion. Pedal pulses 2+, adequate circulation.  The left knee has a superficial abrasion.   Neurological: She is alert and oriented to person, place, and time.  Skin: Skin is warm and dry.  Psychiatric: She has a normal mood and affect. Her behavior is normal.  Nursing note and vitals reviewed.    ED Treatments / Results  Labs (all labs ordered are  listed, but only abnormal results are displayed) Labs Reviewed - No data to display  Radiology Dg Knee Complete 4 Views Left  Result Date: 02/19/2017 CLINICAL DATA:  Recent fall with left knee pain, initial encounter EXAM: LEFT KNEE - COMPLETE 4+ VIEW COMPARISON:  None. FINDINGS: Comminuted patellar fracture is noted without significant distraction of the fracture fragments. No joint effusion is seen. No other focal abnormality is noted. IMPRESSION: Patellar fracture without distraction of the fracture fragments. Electronically Signed   By: Inez Catalina M.D.   On: 02/19/2017 17:44    Procedures Procedures (including critical care time)  Medications Ordered in ED Medications  oxyCODONE-acetaminophen (PERCOCET/ROXICET) 5-325 MG per tablet 1 tablet (1 tablet Oral Given 02/19/17 1842)  ondansetron (ZOFRAN-ODT) disintegrating tablet 4 mg (4 mg Oral Given 02/19/17 1842)     Initial Impression / Assessment and Plan / ED Course  I have reviewed the triage vital signs and the nursing notes. Patient X-Ray with comminuted fracture of the left patella. Pain managed in ED. Pt advised to follow up with orthopedics for further evaluation and treatment.  Knee immobilizer applied, crutches, conservative therapy recommended and discussed. Patient will be dc home & is agreeable with above plan. I have also discussed reasons to return immediately to the ER.  Patient expresses understanding and agrees with plan. Referral to [place] (on call group). Discussed pain medication and that it can cause her to be sleepy.  Dr. Ralene Bathe in to examine the patient and discuss plan of care with the patient and her family.  Final Clinical Impressions(s) / ED Diagnoses   Final diagnoses:  Closed nondisplaced fracture of left patella, unspecified fracture morphology, initial encounter    ED Discharge Orders        Ordered    oxyCODONE-acetaminophen (PERCOCET/ROXICET) 5-325 MG tablet  Every 4 hours PRN      02/19/17 1919    diclofenac (VOLTAREN) 50 MG EC tablet  2 times daily     02/19/17 [place], Aditya Nastasi [place], [place] 02/19/17 2038    Quintella Reichert, MD 02/21/17 [phone]

## 2017-02-19 NOTE — ED Notes (Signed)
PT states understanding of care given, follow up care, and medication prescribed. PT ambulated from ED to car with a steady gait. 

## 2017-02-19 NOTE — ED Triage Notes (Signed)
Family in room agitated because Pt was given the Ice bag filled with ice to place on knee. Family member reported wanting a bag of ice placed on skin. Reported to family that we gave pt the Ice pack provided by ed.

## 2017-02-19 NOTE — Discharge Instructions (Signed)
Call [facility] to schedule a follow up appointment. Return here for any problems.

## 2017-02-19 NOTE — ED Triage Notes (Signed)
Onset 30 minutes PTA pt had hands full, lost footing and fell on both knees and hands.  C/o left knee pain, pt reports she is unable to bend left knee or bear weight on left leg.  Pt c/o bilateral hand pain, reports she is unable to straighten fingers.  After prompting pt was able to make fist and move wrists.
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
## 2017-03-07 ENCOUNTER — Ambulatory Visit: Admitting: Diagnostic Neuroimaging

## 2017-05-03 ENCOUNTER — Ambulatory Visit: Payer: Self-pay | Admitting: Diagnostic Neuroimaging

## 2017-05-09 ENCOUNTER — Encounter: Payer: Self-pay | Admitting: *Deleted

## 2017-05-10 ENCOUNTER — Ambulatory Visit (INDEPENDENT_AMBULATORY_CARE_PROVIDER_SITE_OTHER): Admitting: Diagnostic Neuroimaging

## 2017-05-10 ENCOUNTER — Encounter: Payer: Self-pay | Admitting: Diagnostic Neuroimaging

## 2017-05-10 VITALS — BP 132/83 | HR 71 | Ht 67.0 in | Wt 169.6 lb

## 2017-05-10 DIAGNOSIS — G3184 Mild cognitive impairment, so stated: Secondary | ICD-10-CM

## 2017-05-10 DIAGNOSIS — F0781 Postconcussional syndrome: Secondary | ICD-10-CM
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
## 2017-11-08 ENCOUNTER — Ambulatory Visit: Admitting: Diagnostic Neuroimaging

## 2017-12-28 ENCOUNTER — Other Ambulatory Visit: Payer: Self-pay | Admitting: Family Medicine

## 2017-12-28 DIAGNOSIS — R519 Headache, unspecified: Secondary | ICD-10-CM

## 2017-12-28 DIAGNOSIS — R51 Headache: Principal | ICD-10-CM

## 2017-12-28 DIAGNOSIS — R42 Dizziness and giddiness: Secondary | ICD-10-CM

## 2018-01-05 ENCOUNTER — Ambulatory Visit
Admission: RE | Admit: 2018-01-05 | Discharge: 2018-01-05 | Disposition: A | Discharge status: Home / Self Care | Source: Ambulatory Visit | Attending: Family Medicine | Admitting: Family Medicine

## 2018-01-05 DIAGNOSIS — R51 Headache: Principal | ICD-10-CM

## 2018-01-05 DIAGNOSIS — R42 Dizziness and giddiness: Secondary | ICD-10-CM

## 2018-01-05 DIAGNOSIS — R519 Headache, unspecified: Secondary | ICD-10-CM

## 2018-01-18 ENCOUNTER — Encounter: Payer: Self-pay | Admitting: *Deleted

## 2018-01-22 ENCOUNTER — Encounter: Payer: Self-pay | Admitting: Diagnostic Neuroimaging

## 2018-01-22 ENCOUNTER — Ambulatory Visit (INDEPENDENT_AMBULATORY_CARE_PROVIDER_SITE_OTHER): Admitting: Diagnostic Neuroimaging

## 2018-01-22 VITALS — BP 120/75 | HR 87 | Ht 67.0 in | Wt 174.0 lb

## 2018-01-22 DIAGNOSIS — R269 Unspecified abnormalities of gait and mobility: Secondary | ICD-10-CM

## 2018-01-22 DIAGNOSIS — R42 Dizziness and giddiness: Secondary | ICD-10-CM
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
## 2018-03-07 ENCOUNTER — Ambulatory Visit: Admitting: Diagnostic Neuroimaging

## 2018-11-06 ENCOUNTER — Other Ambulatory Visit: Payer: Self-pay | Admitting: Family Medicine

## 2018-11-06 DIAGNOSIS — N644 Mastodynia: Secondary | ICD-10-CM

## 2018-11-09 ENCOUNTER — Other Ambulatory Visit: Payer: Self-pay

## 2018-11-09 ENCOUNTER — Ambulatory Visit
Admission: RE | Admit: 2018-11-09 | Discharge: 2018-11-09 | Disposition: A | Discharge status: Home / Self Care | Source: Ambulatory Visit | Attending: Family Medicine | Admitting: Family Medicine

## 2018-11-09 DIAGNOSIS — N644 Mastodynia: Secondary | ICD-10-CM

## 2018-11-22 ENCOUNTER — Other Ambulatory Visit: Payer: Self-pay

## 2018-11-22 DIAGNOSIS — Z20822 Contact with and (suspected) exposure to covid-19: Secondary | ICD-10-CM

## 2018-11-23 LAB — NOVEL CORONAVIRUS, NAA: SARS-CoV-2, NAA: NOT DETECTED

## 2019-10-29 ENCOUNTER — Encounter (HOSPITAL_COMMUNITY): Payer: Self-pay

## 2019-10-29 ENCOUNTER — Ambulatory Visit (INDEPENDENT_AMBULATORY_CARE_PROVIDER_SITE_OTHER)

## 2019-10-29 ENCOUNTER — Other Ambulatory Visit: Payer: Self-pay

## 2019-10-29 ENCOUNTER — Ambulatory Visit (HOSPITAL_COMMUNITY)
Admission: EM | Admit: 2019-10-29 | Discharge: 2019-10-29 | Disposition: A | Discharge status: Home / Self Care | Attending: Physician Assistant | Admitting: Physician Assistant

## 2019-10-29 DIAGNOSIS — W19XXXA Unspecified fall, initial encounter: Secondary | ICD-10-CM | POA: Diagnosis not present

## 2019-10-29 DIAGNOSIS — M79672 Pain in left foot: Secondary | ICD-10-CM

## 2019-10-29 DIAGNOSIS — S8011XA Contusion of right lower leg, initial encounter: Secondary | ICD-10-CM

## 2019-10-29 DIAGNOSIS — M25531 Pain in right wrist: Secondary | ICD-10-CM

## 2019-10-29 DIAGNOSIS — S63501A Unspecified sprain of right wrist, initial encounter: Secondary | ICD-10-CM | POA: Diagnosis not present

## 2019-10-29 MED ORDER — ACETAMINOPHEN 325 MG PO TABS
650.0000 mg | ORAL_TABLET | Freq: Four times a day (QID) | ORAL | 0 refills | Status: AC | PRN
Start: 2019-10-29 — End: ?

## 2019-10-29 MED ORDER — NAPROXEN 375 MG PO TABS
375.0000 mg | ORAL_TABLET | Freq: Two times a day (BID) | ORAL | 0 refills | Status: AC
Start: 2019-10-29 — End: ?

## 2019-10-29 NOTE — ED Triage Notes (Signed)
Pt presents to UC for fall from standing. Pt states she fell onto right side and is now having pain in right arm and hip. Pt also complaining of pain in left ankle. Pt denies meds PTA. Pt has been treating with ice. Upon assessment pt has superficial abrasions on right hand and elbow. No swelling or bruising present on exam. Pt noted to have full range of motion in all extremities.

## 2019-10-29 NOTE — Discharge Instructions (Signed)
Wear the wrist brace until you have follow up with your Primary care - schedule follow up in 7-10 days  Take medicines as prescribed. Ice wrist and bruising.  If significant pain increase, large swelling around areas of bruising return or follow up with your PCP  Light walking will help decrease soreness, no strenuous exercise for 1 week
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
## 2019-12-10 ENCOUNTER — Other Ambulatory Visit: Payer: Self-pay | Admitting: Family Medicine

## 2019-12-10 DIAGNOSIS — Z1231 Encounter for screening mammogram for malignant neoplasm of breast: Secondary | ICD-10-CM

## 2019-12-24 ENCOUNTER — Ambulatory Visit

## 2020-01-03 ENCOUNTER — Ambulatory Visit
Admission: RE | Admit: 2020-01-03 | Discharge: 2020-01-03 | Disposition: A | Discharge status: Home / Self Care | Source: Ambulatory Visit | Attending: Family Medicine | Admitting: Family Medicine

## 2020-01-03 ENCOUNTER — Other Ambulatory Visit: Payer: Self-pay

## 2020-01-03 DIAGNOSIS — Z1231 Encounter for screening mammogram for malignant neoplasm of breast: Secondary | ICD-10-CM

## 2020-01-07 ENCOUNTER — Other Ambulatory Visit: Payer: Self-pay | Admitting: Family Medicine

## 2020-01-07 ENCOUNTER — Ambulatory Visit
Admission: RE | Admit: 2020-01-07 | Discharge: 2020-01-07 | Disposition: A | Discharge status: Home / Self Care | Source: Ambulatory Visit | Attending: Family Medicine | Admitting: Family Medicine

## 2020-01-07 ENCOUNTER — Other Ambulatory Visit: Payer: Self-pay

## 2020-01-07 DIAGNOSIS — N6320 Unspecified lump in the left breast, unspecified quadrant: Secondary | ICD-10-CM

## 2021-11-22 IMAGING — MG DIGITAL SCREENING BILAT W/ TOMO W/ CAD
8 series · 9 of 24 positions shown · non-contrast
Comparison: Previous exam(s).

CLINICAL DATA: Screening.

EXAM:
DIGITAL SCREENING BILATERAL MAMMOGRAM WITH TOMO AND CAD

[L MLO synth-2D]
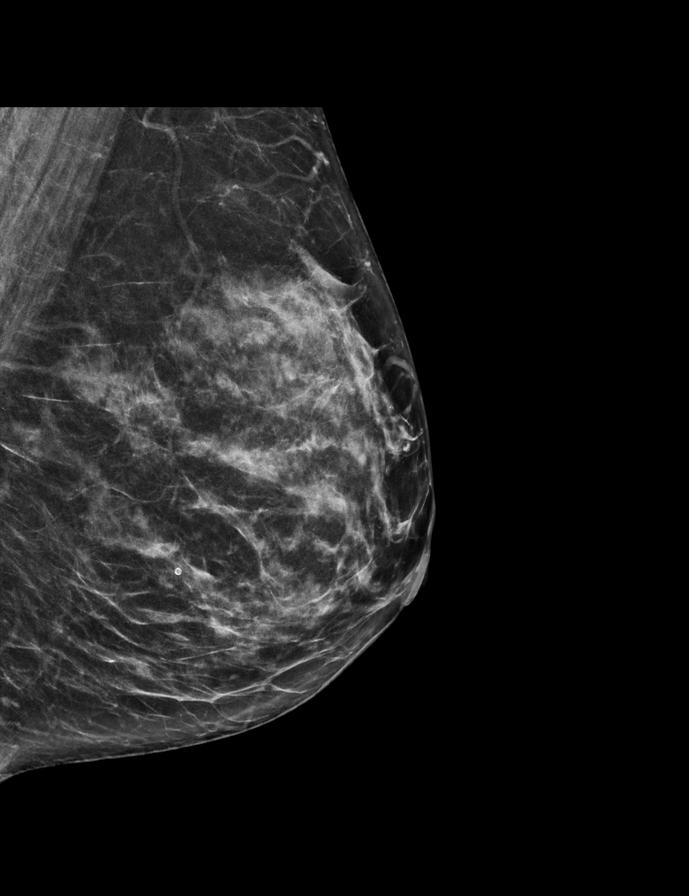

[L CC synth-2D]
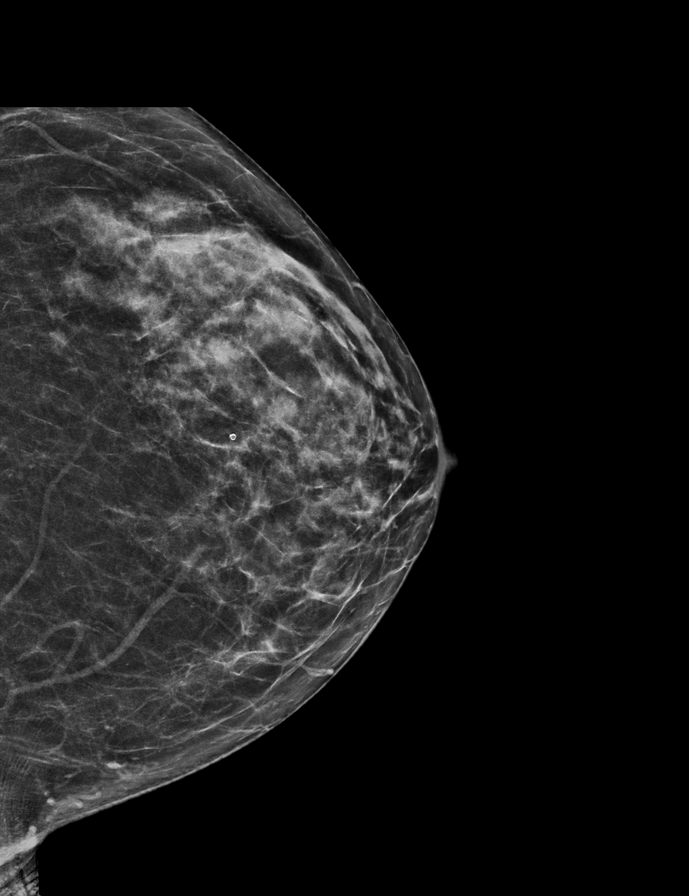

[R MLO synth-2D]
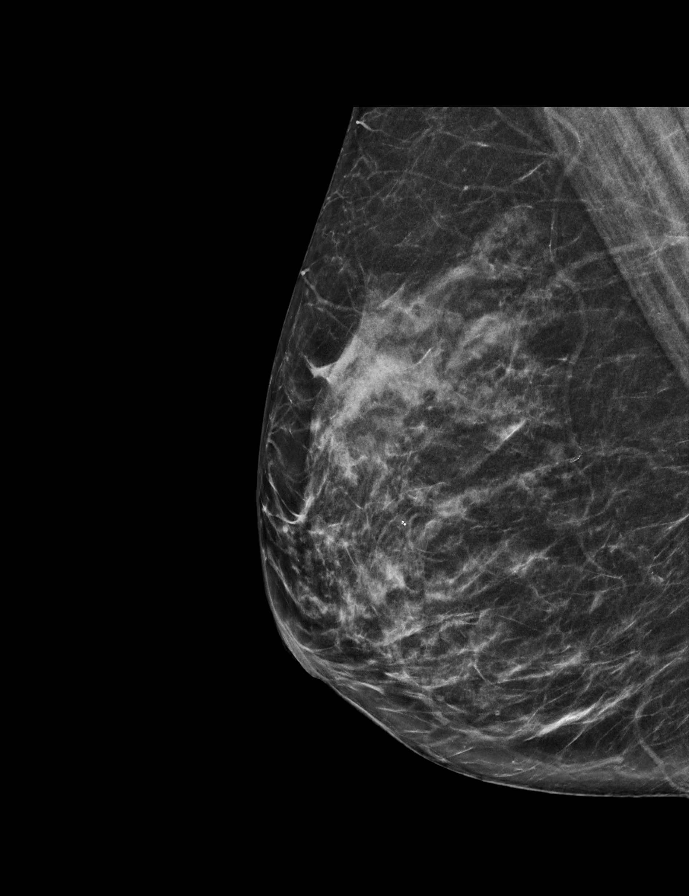

[R CC synth-2D]
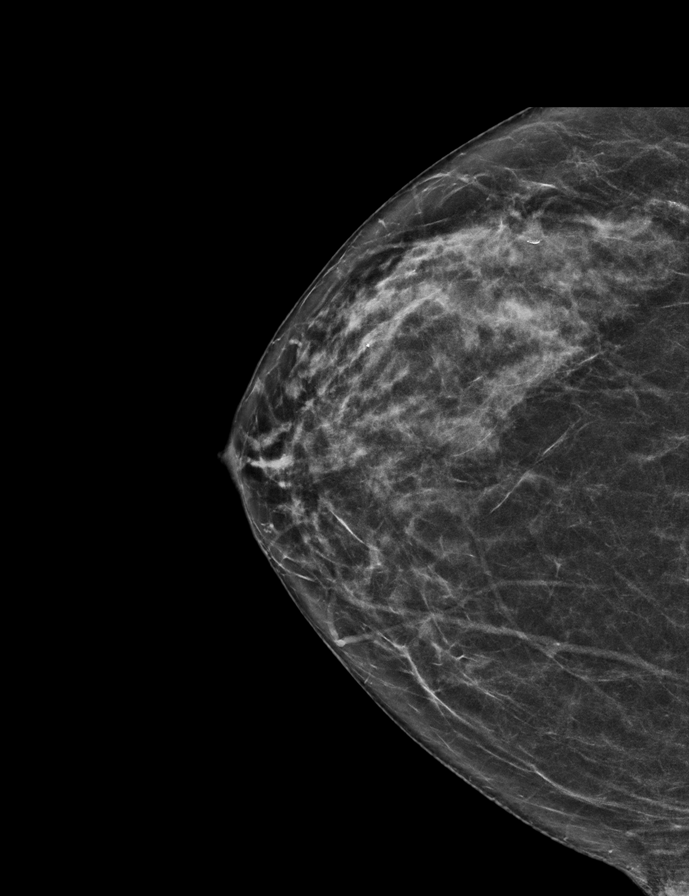

[L MLO tomo · 2 of 62 frames shown]
[frame 21/62]
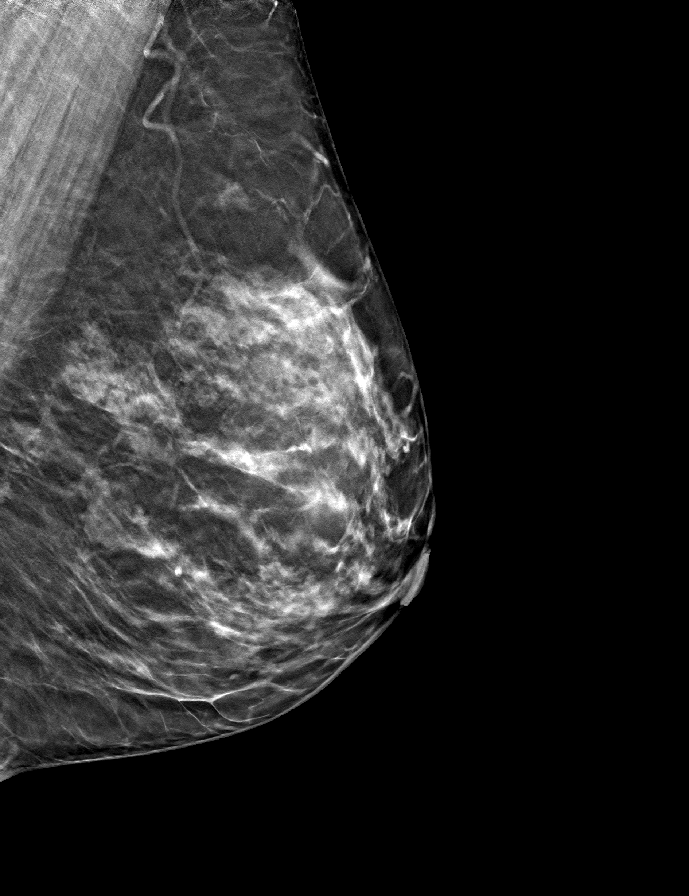
[frame 31/62]
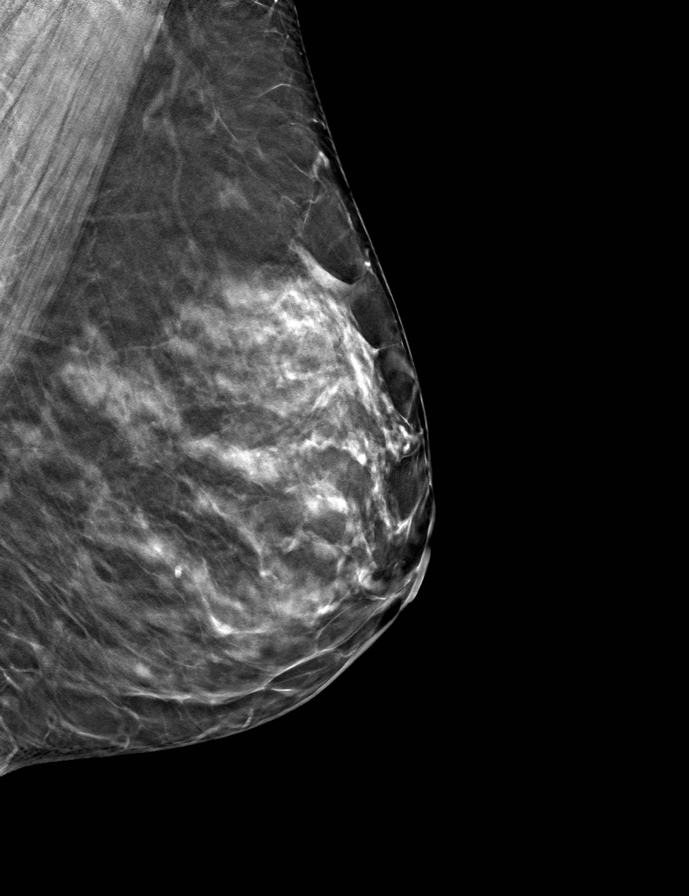

[R MLO tomo · tomo slice 31/62.0]
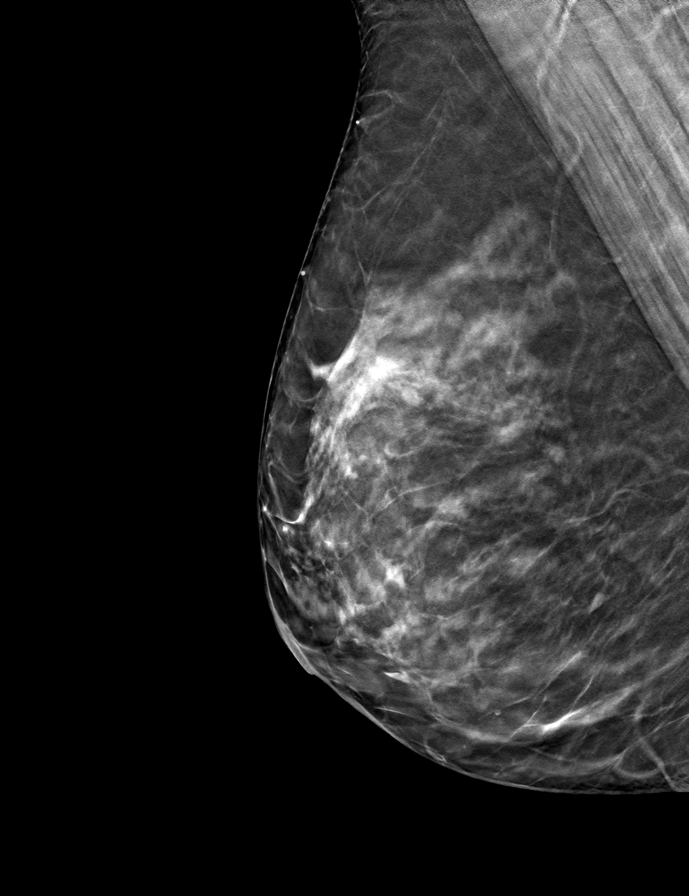

[L CC tomo · tomo slice 30/59.0]
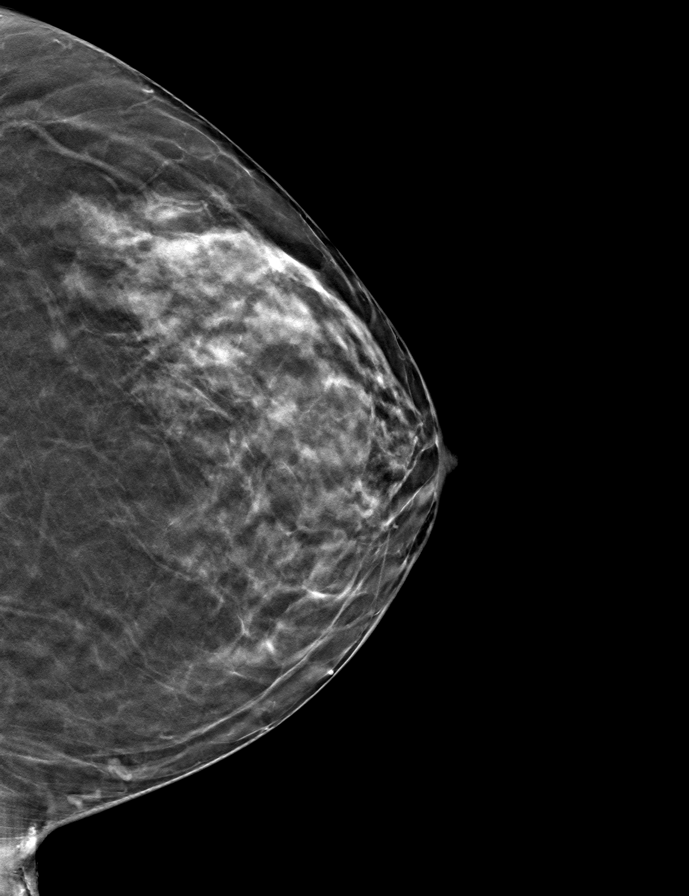

[R CC tomo · tomo slice 31/62.0]
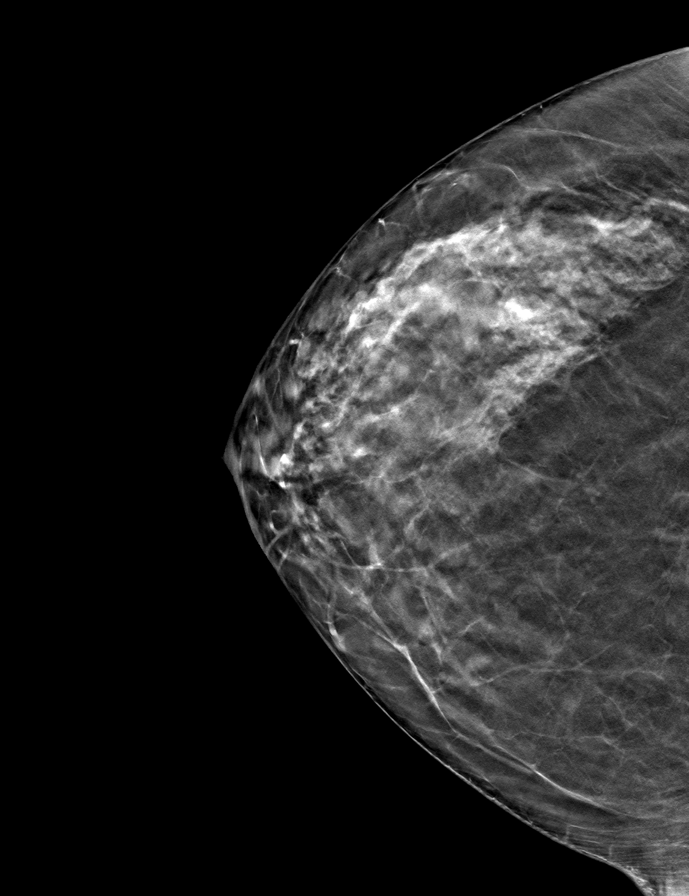

[9 of 24 positions shown; findings below may reference images not displayed]

ACR Breast Density Category c: The breast tissue is heterogeneously
dense, which may obscure small masses.
FINDINGS: In the left breast, a possible mass warrants further evaluation. In
the right breast, no findings suspicious for malignancy.

Images were processed with CAD.
IMPRESSION: Further evaluation is suggested for a possible mass in the left
breast.

RECOMMENDATION:
Diagnostic mammogram and possibly ultrasound of the left breast.
(Code:[id number]-H-77W)

The patient will be contacted regarding the findings, and additional
imaging will be scheduled.

BI-RADS CATEGORY  0: Incomplete. Need additional imaging evaluation
and/or prior mammograms for comparison.

## 2021-11-26 IMAGING — US US BREAST*L* LIMITED INC AXILLA
1 series · 9 of 9 positions shown · non-contrast
Comparison: Previous exams including recent screening mammogram
dated 01/03/2020.

CLINICAL DATA: Patient returns today to evaluate a possible LEFT
breast mass identified on recent screening mammogram.

EXAM:
DIGITAL DIAGNOSTIC LEFT MAMMOGRAM WITH CAD AND TOMO
ULTRASOUND LEFT BREAST

[Series 1: us breast*left* limited inc axilla · 0.06mm/px · 9 of 9 slices shown]
[im 1/9]
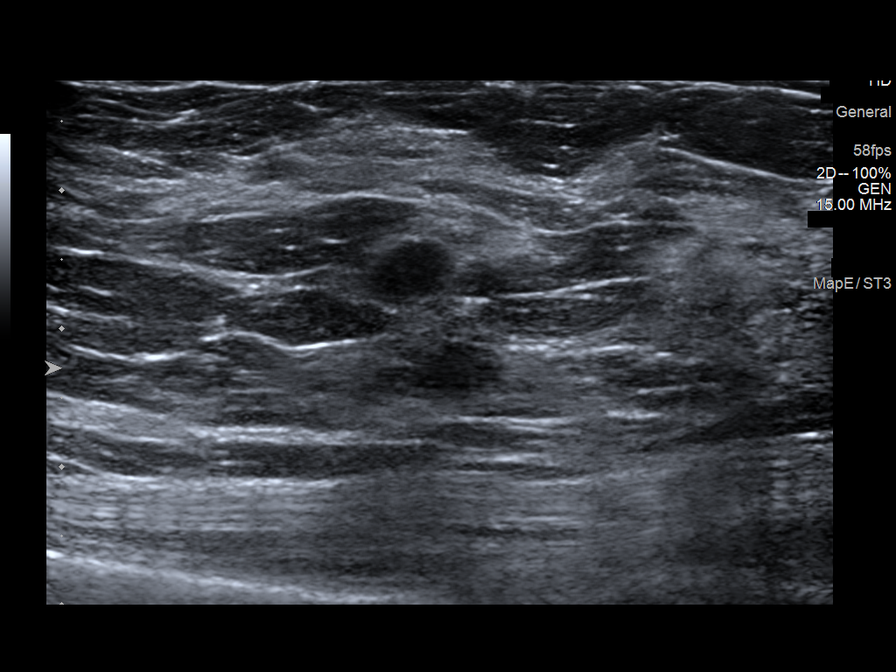
[im 2/9]
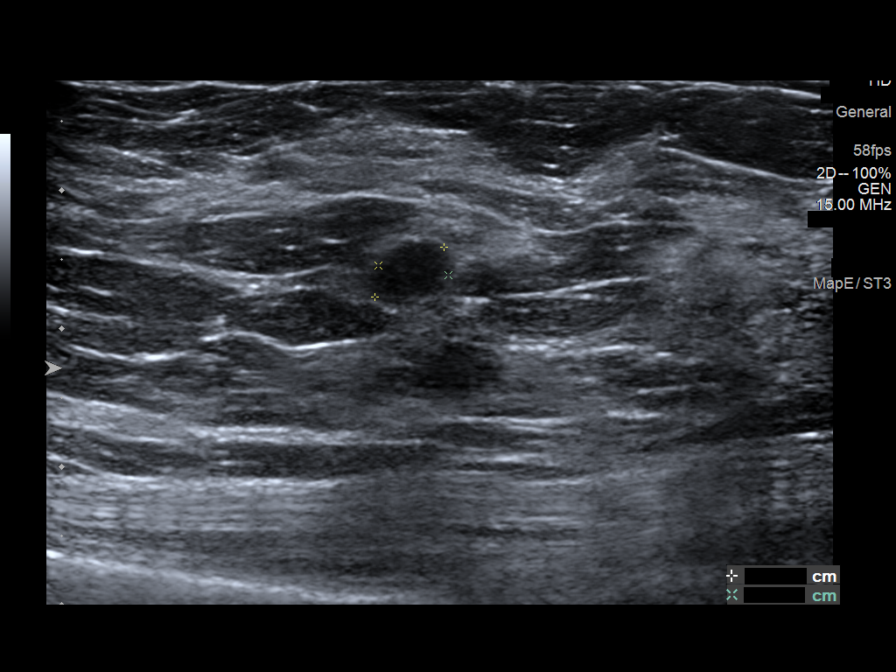
[im 3/9]
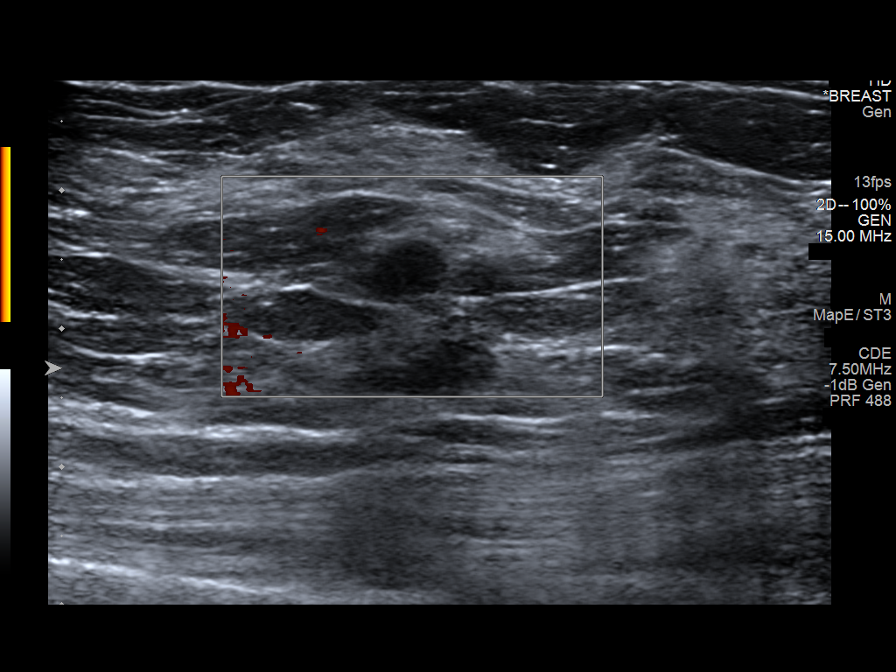
[im 4/9]
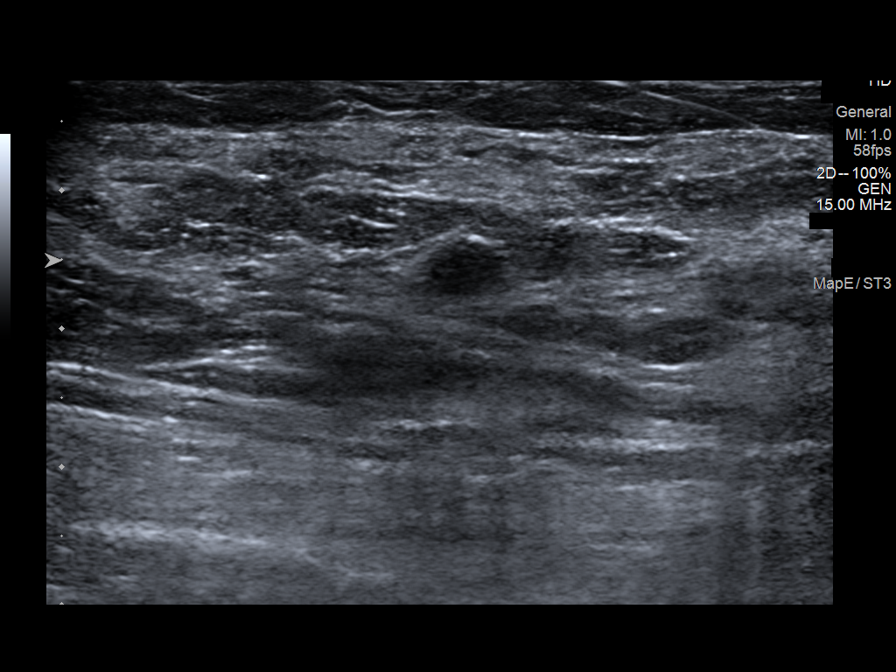
[im 5/9]
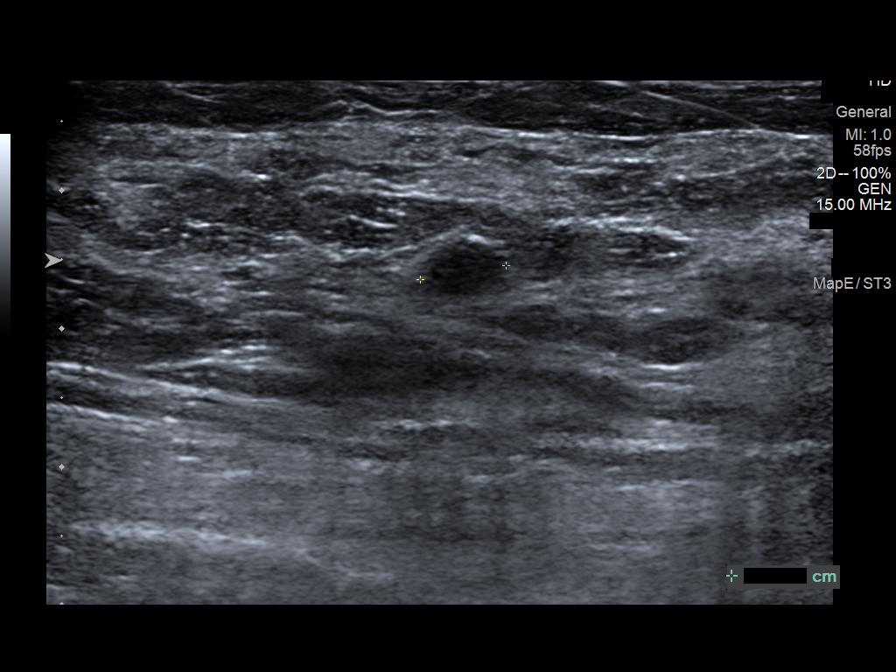
[im 6/9]
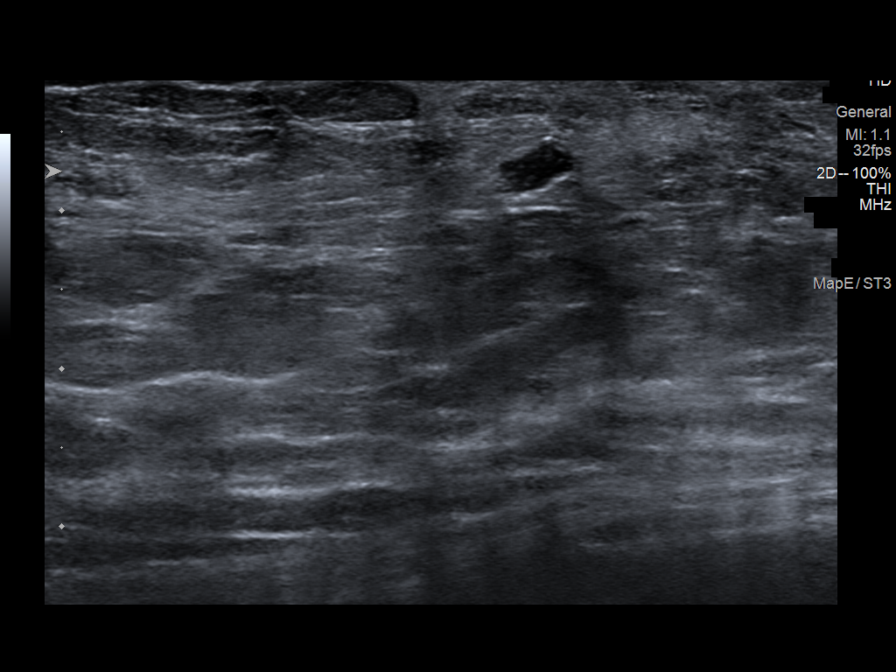
[im 7/9]
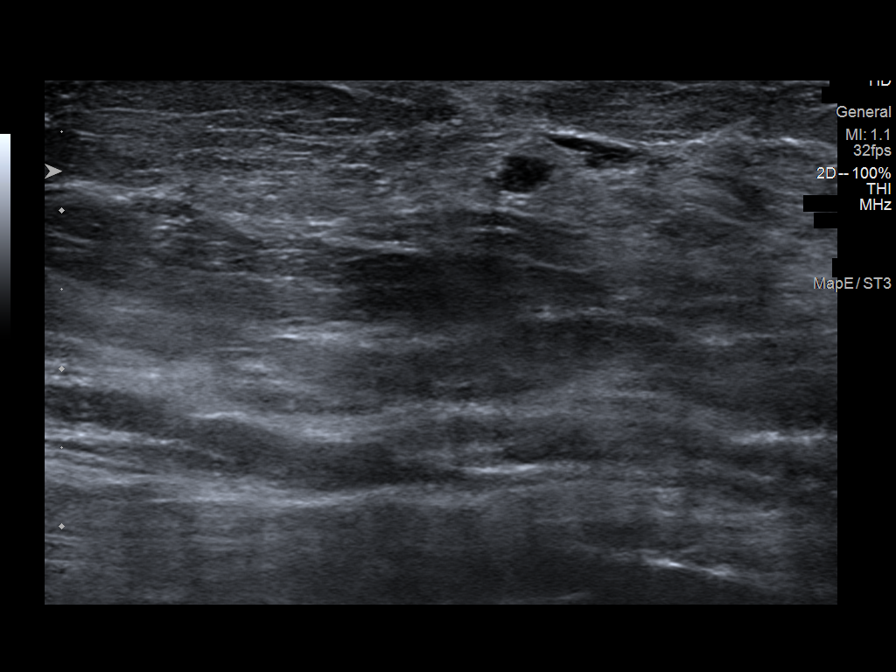
[im 8/9]
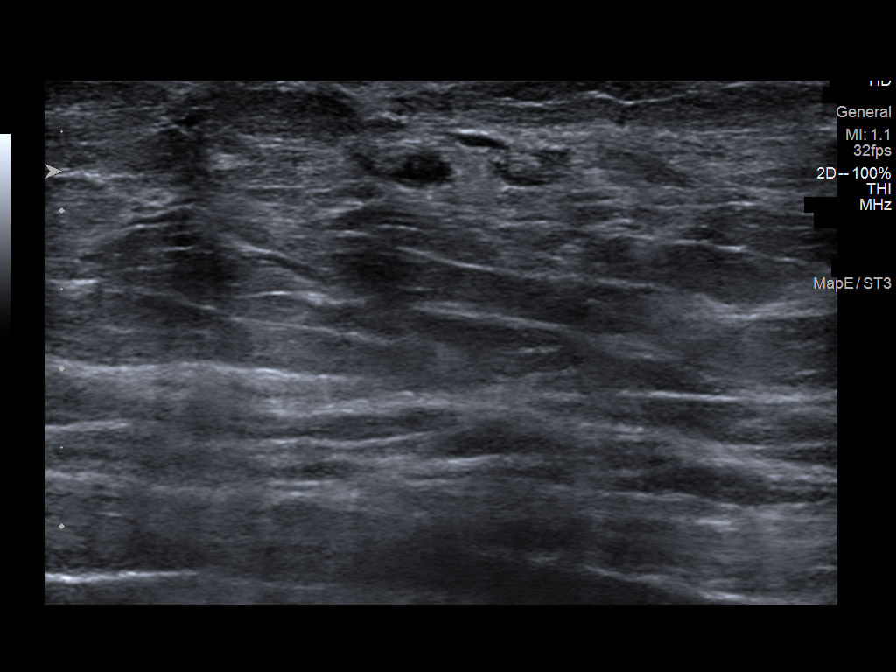
[im 9/9]
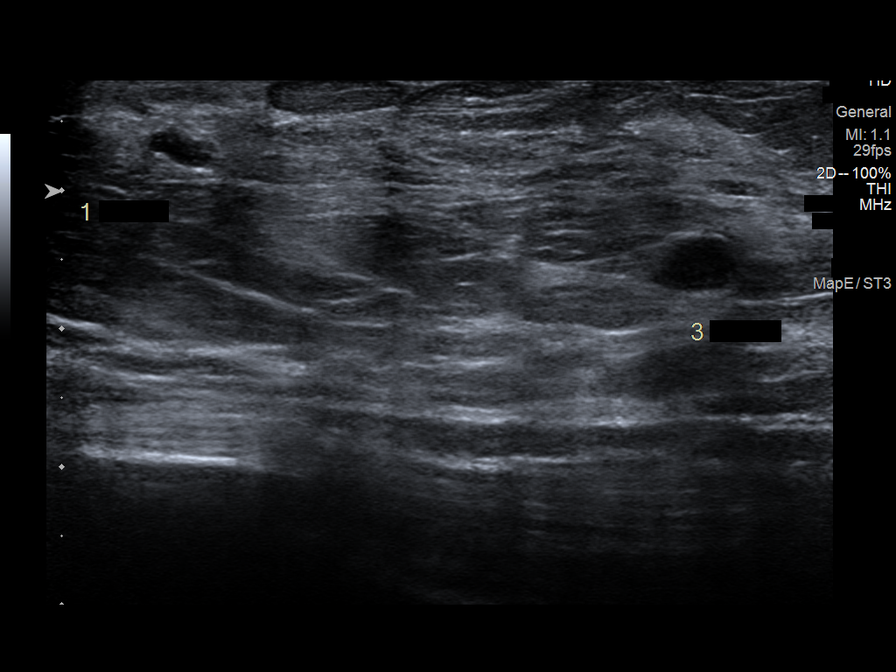

[9 of 9 positions shown; findings below may reference images not displayed]

ACR Breast Density Category c: The breast tissue is heterogeneously
dense, which may obscure small masses.
FINDINGS: Partially obscured low-density mass is confirmed within the slightly
outer LEFT breast, 1-2 o'clock axis region, anterior to middle
depth, seen on CC views only, measuring approximately 7 mm greatest
dimension.

Mammographic images were processed with CAD.

Targeted ultrasound is performed, again showing a nearly anechoic
mass within the LEFT breast at the 1 o'clock axis, 1 cm from the
nipple, at superficial depth, measuring 5 mm greatest dimension,
decreased in size compared to the previous study confirming
benignity, compatible with a benign cyst.

There is an additional oval circumscribed hypoechoic mass in the
LEFT breast at the 1 o'clock axis, 1 cm from the nipple, measuring 6
mm, also decreased in size compared to previous studies confirming
benignity.
IMPRESSION: No evidence of malignancy within the LEFT breast. Benign masses
within the LEFT breast at the 1 o'clock axis, both decreased in size
compared to previous exams confirming benignity, compatible with
complicated cysts.

Patient may return to routine annual bilateral screening mammogram
schedule.

RECOMMENDATION:
Screening mammogram in one year.(Code:E5-I-ISS)

I have discussed the findings and recommendations with the patient.
If applicable, a reminder letter will be sent to the patient
regarding the next appointment.

BI-RADS CATEGORY  2: Benign.

## 2022-09-02 ENCOUNTER — Encounter (HOSPITAL_COMMUNITY): Payer: Self-pay | Admitting: *Deleted

## 2022-09-02 ENCOUNTER — Emergency Department (HOSPITAL_COMMUNITY)
Admission: EM | Admit: 2022-09-02 | Discharge: 2022-09-02 | Disposition: A | Discharge status: Home / Self Care | Attending: Emergency Medicine | Admitting: Emergency Medicine

## 2022-09-02 ENCOUNTER — Other Ambulatory Visit: Payer: Self-pay

## 2022-09-02 ENCOUNTER — Emergency Department (HOSPITAL_COMMUNITY)

## 2022-09-02 DIAGNOSIS — X501XXA Overexertion from prolonged static or awkward postures, initial encounter: Secondary | ICD-10-CM | POA: Diagnosis not present

## 2022-09-02 DIAGNOSIS — S93402A Sprain of unspecified ligament of left ankle, initial encounter: Secondary | ICD-10-CM | POA: Insufficient documentation

## 2022-09-02 DIAGNOSIS — R030 Elevated blood-pressure reading, without diagnosis of hypertension: Secondary | ICD-10-CM | POA: Diagnosis not present

## 2022-09-02 DIAGNOSIS — S99912A Unspecified injury of left ankle, initial encounter: Secondary | ICD-10-CM | POA: Diagnosis present

## 2022-09-02 MED ORDER — ACETAMINOPHEN 325 MG PO TABS
650.0000 mg | ORAL_TABLET | Freq: Once | ORAL | Status: AC
  Administered 2022-09-02: 650 mg via ORAL
  Filled 2022-09-02: qty 2

## 2022-09-02 MED ORDER — IBUPROFEN 200 MG PO TABS
400.0000 mg | ORAL_TABLET | Freq: Once | ORAL | Status: AC
  Administered 2022-09-02: 400 mg via ORAL
  Filled 2022-09-02: qty 2

## 2022-09-02 NOTE — Progress Notes (Signed)
Orthopedic Tech Progress Note Patient Details:  Norma Bowman Jan 11, 1956 [id number]  Ortho Devices Type of Ortho Device: Ankle Air splint, Ace wrap Ortho Device/Splint Location: Left ankle Ortho Device/Splint Interventions: Application   Post Interventions Patient Tolerated: Well Instructions Provided: Adjustment of device  Norma Bowman [name] 09/02/2022, 1:52 PM

## 2022-09-02 NOTE — ED Triage Notes (Signed)
Left ankle pain and swelling to left ankle since rolling her ankle pta.

## 2022-09-02 NOTE — ED Provider Notes (Signed)
EMERGENCY DEPARTMENT AT [facility] Provider Note   CSN: [id number] Arrival date & time: 09/02/22  1138     History  Chief Complaint  Patient presents with   Ankle Pain    Norma Bowman is a 67 y.o. female.  Pt c/o left ankle injury today. Rolled ankle. C/o pain laterally with increased pain w bearing weight. Skin intact. Denies other pain or injury. No knee pain. No numbness/weakness.   The history is provided by the patient.  Ankle Pain Associated symptoms: no fever        Home Medications Prior to Admission medications   Medication Sig Start Date End Date Taking? Authorizing Provider  acetaminophen (TYLENOL) 325 MG tablet Take 2 tablets (650 mg total) by mouth every 6 (six) hours as needed. 10/29/19   Darr, Gerilyn Pilgrim, PA-C  atorvastatin (LIPITOR) 10 MG tablet 10 mg daily. 01/10/18   [provider]  ibuprofen (ADVIL,MOTRIN) 200 MG tablet Take 600 mg by mouth every 6 (six) hours as needed for fever or mild pain.    [provider]  Multiple Vitamin (MULTIVITAMIN) tablet Take 1 tablet by mouth daily.    [provider]  naproxen (NAPROSYN) 375 MG tablet Take 1 tablet (375 mg total) by mouth 2 (two) times daily. 10/29/19   Darr, Gerilyn Pilgrim, PA-C      Allergies    Wellbutrin [bupropion]    Review of Systems   Review of Systems  Constitutional:  Negative for fever.  Gastrointestinal:  Negative for nausea and vomiting.  Musculoskeletal:        Left ankle injury  Skin:  Negative for wound.  Neurological:  Negative for weakness and numbness.    Physical Exam Updated Vital Signs BP (!) 165/84 (BP Location: Right Arm)   Pulse 69   Temp 97.6 F (36.4 C) (Oral)   Resp 16   SpO2 94%  Physical Exam Vitals and nursing note reviewed.  Constitutional:      Appearance: Normal appearance. She is well-developed.  HENT:     Head: Atraumatic.     Nose: Nose normal.  Eyes:     General: No scleral icterus.    Conjunctiva/sclera:  Conjunctivae normal.  Neck:     Trachea: No tracheal deviation.  Cardiovascular:     Rate and Rhythm: Normal rate.     Pulses: Normal pulses.  Pulmonary:     Effort: Pulmonary effort is normal. No respiratory distress.  Musculoskeletal:        General: No swelling.     Cervical back: No muscular tenderness.     Comments: Left ankle sts and tenderness lateral malleolus. Ankle is grossly stable. Skin is intact. Distal pulses palp. Foot is of normal color and warmth. No 5th mt tenderness. No prox tib/fib or knee pain or tenderness.   Skin:    General: Skin is warm and dry.     Findings: No rash.  Neurological:     Mental Status: She is alert.     Comments: Alert, speech normal. Left foot nvi.   Psychiatric:        Mood and Affect: Mood normal.     ED Results / Procedures / Treatments   Labs (all labs ordered are listed, but only abnormal results are displayed) Labs Reviewed - No data to display  EKG None  Radiology DG Ankle Complete Left  Result Date: 09/02/2022 CLINICAL DATA:  Swelling and pain. EXAM: LEFT ANKLE COMPLETE - 3+ VIEW COMPARISON:  None Available. FINDINGS: Three  views of the left ankle. No evidence of fracture, dislocation, or joint effusion. There is no evidence of arthropathy or other focal bone abnormality. Moderate soft tissue swelling about the lateral malleolus. IMPRESSION: Moderate soft tissue swelling about the lateral malleolus. No evidence of fracture or dislocation. Electronically Signed   By: Orvan Falconer M.D.   On: 09/02/2022 13:21    Procedures Procedures    Medications Ordered in ED Medications  acetaminophen (TYLENOL) tablet 650 mg (has no administration in time range)  ibuprofen (ADVIL) tablet 400 mg (has no administration in time range)    ED Course/ Medical Decision Making/ A&P                             Medical Decision Making Problems Addressed: Elevated blood pressure reading: acute illness or injury Sprain of left ankle,  unspecified ligament, initial encounter: acute illness or injury with systemic symptoms that poses a threat to life or bodily functions  Amount and/or Complexity of Data Reviewed External Data Reviewed: notes. Radiology: ordered and independent interpretation performed. Decision-making details documented in ED Course.  Risk OTC drugs.   Xrays. Icepack.   Reviewed nursing notes and prior charts for additional history.   Xrays reviewed/interpreted by me - no fx.  Acetaminophen po. Ibuprofen po. Ankle brace.  Pt appears stable for d/c.            Final Clinical Impression(s) / ED Diagnoses Final diagnoses:  Sprain of left ankle, unspecified ligament, initial encounter  Elevated blood pressure reading    Rx / DC Orders ED Discharge Orders     None         Cathren Laine, MD 09/02/22 1329

## 2022-09-02 NOTE — Discharge Instructions (Addendum)
It was our pleasure to provide your ER care today - we hope that you feel better.  Wear ankle brace as need for comfort/support for the next few days. Elevate/icepack.  Take acetaminophen or ibuprofen as need.   Follow up with primary care doctor in one week if symptoms fail to improve/resolve. Also follow up with your doctor regarding your blood pressure that is high today.   Return to ER if worse, new symptoms, new/severe pain, or other concern.
